# Patient Record
Sex: Female | Born: 1995 | Race: Black or African American | Hispanic: No | Marital: Single | Smoking: Never smoker
Health system: Southern US, Community
[De-identification: ages and names within clinical notes are randomized; demographics above are authoritative.]

## PROBLEM LIST (undated history)

## (undated) DIAGNOSIS — K219 Gastro-esophageal reflux disease without esophagitis: Secondary | ICD-10-CM

## (undated) DIAGNOSIS — N200 Calculus of kidney: Secondary | ICD-10-CM

## (undated) DIAGNOSIS — F419 Anxiety disorder, unspecified: Secondary | ICD-10-CM

## (undated) DIAGNOSIS — J45909 Unspecified asthma, uncomplicated: Secondary | ICD-10-CM

## (undated) DIAGNOSIS — D649 Anemia, unspecified: Secondary | ICD-10-CM

## (undated) HISTORY — DX: Anxiety disorder, unspecified: F41.9

## (undated) HISTORY — DX: Calculus of kidney: N20.0

## (undated) HISTORY — PX: APPENDECTOMY: SHX54

---
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
## 2016-05-14 ENCOUNTER — Emergency Department
Admission: EM | Admit: 2016-05-14 | Discharge: 2016-05-14 | Disposition: A | Discharge status: Home / Self Care | Payer: Self-pay | Attending: Emergency Medicine | Admitting: Emergency Medicine

## 2016-05-14 DIAGNOSIS — Y990 Civilian activity done for income or pay: Secondary | ICD-10-CM | POA: Insufficient documentation

## 2016-05-14 DIAGNOSIS — M25531 Pain in right wrist: Secondary | ICD-10-CM | POA: Insufficient documentation

## 2016-05-14 DIAGNOSIS — Z79899 Other long term (current) drug therapy: Secondary | ICD-10-CM | POA: Insufficient documentation

## 2016-05-14 DIAGNOSIS — X503XXA Overexertion from repetitive movements, initial encounter: Secondary | ICD-10-CM | POA: Insufficient documentation

## 2016-05-14 DIAGNOSIS — Y9389 Activity, other specified: Secondary | ICD-10-CM | POA: Insufficient documentation

## 2016-05-14 DIAGNOSIS — H578 Other specified disorders of eye and adnexa: Secondary | ICD-10-CM | POA: Insufficient documentation

## 2016-05-14 DIAGNOSIS — Y929 Unspecified place or not applicable: Secondary | ICD-10-CM | POA: Insufficient documentation

## 2016-05-14 DIAGNOSIS — M25831 Other specified joint disorders, right wrist: Secondary | ICD-10-CM

## 2016-05-14 DIAGNOSIS — J45909 Unspecified asthma, uncomplicated: Secondary | ICD-10-CM | POA: Insufficient documentation

## 2016-05-14 MED ORDER — NAPROXEN 500 MG PO TABS: 500.0000 mg | ORAL_TABLET | Freq: Two times a day (BID) | ORAL | 0 refills | Status: DC

## 2016-05-14 MED ORDER — NAPROXEN 500 MG PO TABS
500.0000 mg | ORAL_TABLET | Freq: Once | ORAL | Status: AC
  Administered 2016-05-14: 500 mg via ORAL
  Filled 2016-05-14: qty 1

## 2016-05-14 NOTE — Discharge Instructions (Signed)
Wrist splint for 10-14 days while working.

## 2016-05-14 NOTE — ED Triage Notes (Signed)
Pt presents to ED with c/o RIGHT hand and wrist pain with swelling that started 2 weeks ago. Pt denies any known recent injury or trauma, CMS intact. Pt reports pain is a 6/10 with radiation into her RIGHT forearm.

## 2016-05-14 NOTE — ED Provider Notes (Signed)
[facility] Emergency Department Provider Note   ____________________________________________   First MD Initiated Contact with Patient 05/14/16 2138     (approximate)  I have reviewed the triage vital signs and the nursing notes.   HISTORY  Chief Complaint Hand Pain    HPI Cheryl Davenport is a 20 y.o. female patient complaining of right wrist and hand pain for 2 weeks. Patient denies any provocative incident. Further history revealed repetitive flexion and extension motions of her hand at work. Patient is right-hand dominant. Patient stated time she is a intermitting numbness to the third through the fifth digit on her right hand.Patient's stay when his pain she rates as a 6/10. Patient describes her pain as "sharp and achy". Patient is using an elastic wrist support which stay has not relieved her complaint.    Past Medical History:  Diagnosis Date  . Anemia   . Anxiety   . Asthma   . GERD (gastroesophageal reflux disease)   . Renal stone     Patient Active Problem List   Diagnosis Date Noted  . Abdominal pain, epigastric   . Other diseases of stomach and duodenum   . Kidney stones 03/11/2015    Past Surgical History:  Procedure Laterality Date  . APPENDECTOMY    . CYSTOSCOPY W/ RETROGRADES Right 03/20/2015   Procedure: CYSTOSCOPY WITH RETROGRADE PYELOGRAM;  Surgeon: Crist FatBenjamin W Herrick, MD;  Location: [facility];  Service: Urology;  Laterality: Right;  . CYSTOSCOPY/URETEROSCOPY/HOLMIUM LASER/STENT PLACEMENT Right 03/20/2015   Procedure: CYSTOSCOPY/URETEROSCOPY/HOLMIUM LASER/STENT PLACEMENT;  Surgeon: Crist FatBenjamin W Herrick, MD;  Location: [facility];  Service: Urology;  Laterality: Right;  . ESOPHAGOGASTRODUODENOSCOPY (EGD) WITH PROPOFOL N/A 10/12/2015   Procedure: ESOPHAGOGASTRODUODENOSCOPY (EGD) WITH PROPOFOL;  Surgeon: Midge Miniumarren Wohl, MD;  [facility];  Service: Endoscopy;  Laterality: N/A;  . EXTRACORPOREAL SHOCK WAVE LITHOTRIPSY  Right 02/01/2015   Procedure: EXTRACORPOREAL SHOCK WAVE LITHOTRIPSY (ESWL);  Surgeon: Lorraine Laxichard D Hart, MD;  Location: [facility];  Service: Urology;  Laterality: Right;  . WISDOM TOOTH EXTRACTION  2016    Prior to Admission medications   Medication Sig Start Date End Date Taking? Authorizing Provider  calcium carbonate (TUMS - DOSED IN MG ELEMENTAL CALCIUM) 500 MG chewable tablet Chew 1 tablet by mouth as needed for indigestion or heartburn.    Historical Provider, MD  dexlansoprazole (DEXILANT) 60 MG capsule Take 1 capsule (60 mg total) by mouth daily. Patient not taking: Reported on 10/08/2015 10/01/15   Gayla DossEryka A Gayle, MD  famotidine (PEPCID) 40 MG tablet Take 1 tablet (40 mg total) by mouth every evening. Patient not taking: Reported on 10/08/2015 09/15/15 09/14/16  Rebecka ApleyAllison P Webster, MD  naproxen (NAPROSYN) 500 MG tablet Take 1 tablet (500 mg total) by mouth 2 (two) times daily with a meal. 05/14/16   Joni Reiningonald K Arlynn Mcdermid, PA-C  sucralfate (CARAFATE) 1 g tablet Take 1 tablet (1 g total) by mouth 2 (two) times daily. Patient not taking: Reported on 10/08/2015 09/15/15   Rebecka ApleyAllison P Webster, MD  traMADol (ULTRAM) 50 MG tablet Take 1 tablet (50 mg total) by mouth every 8 (eight) hours as needed for severe pain (Do not drive while taking this medication.). 10/01/15 09/30/16  Gayla DossEryka A Gayle, MD    Allergies Red dye  Family History  Problem Relation Age of Onset  . Kidney disease Maternal Grandmother   . Prostate cancer Neg Hx   . Bladder Cancer Neg Hx   . Diabetes Neg Hx   . Hypertension Neg Hx  Social History Social History  Substance Use Topics  . Smoking status: Never Smoker  . Smokeless tobacco: Never Used  . Alcohol use Yes     Comment: rarely, 1x/mo    Review of Systems Constitutional: No fever/chills Eyes: No visual changes. ENT: No sore throat. Cardiovascular: Denies chest pain. Respiratory: Denies shortness of breath. Gastrointestinal: No abdominal pain.  No nausea, no vomiting.  No  diarrhea.  No constipation. Genitourinary: Negative for dysuria. Musculoskeletal: Right hand pain Skin: Negative for rash. Neurological: Negative for headaches, focal weakness or numbness. Allergic/Immunilogical: Red dye  ____________________________________________   PHYSICAL EXAM:  VITAL SIGNS: ED Triage Vitals  Enc Vitals Group     BP 05/14/16 2115 117/73     Pulse Rate 05/14/16 2115 74     Resp 05/14/16 2115 16     Temp 05/14/16 2115 98.9 F (37.2 C)     Temp Source 05/14/16 2115 Oral     SpO2 05/14/16 2115 100 %     Weight 05/14/16 2116 140 lb (63.5 kg)     Height 05/14/16 2116 5\' 4"  (1.626 m)     Head Circumference --      Peak Flow --      Pain Score 05/14/16 2117 6     Pain Loc --      Pain Edu? --      Excl. in GC? --     Constitutional: Alert and oriented. Well appearing and in no acute distress. Eyes: Conjunctivae are normal. PERRL. EOMI. Head: Atraumatic. Nose: No congestion/rhinnorhea. Mouth/Throat: Mucous membranes are moist.  Oropharynx non-erythematous. Neck: No stridor.  No cervical spine tenderness to palpation. Hematological/Lymphatic/Immunilogical: No cervical lymphadenopathy. Cardiovascular: Normal rate, regular rhythm. Grossly normal heart sounds.  Good peripheral circulation. Respiratory: Normal respiratory effort.  No retractions. Lungs CTAB. Gastrointestinal: Soft and nontender. No distention. No abdominal bruits. No CVA tenderness. MusculoskeletalNo obvious deformity edema or erythema to the right wrist and hand. Patient had full nuchal range of motion of the right hand and wrist. Patient has moderate guarding palpation of the distal ulnar and radius.  Neurologic:  Normal speech and language. No gross focal neurologic deficits are appreciated. No gait instability. Skin:  Skin is warm, dry and intact. No rash noted. No edema ecchymosis or erythema. Psychiatric: Mood and affect are normal. Speech and behavior are  normal.  ____________________________________________   LABS (all labs ordered are listed, but only abnormal results are displayed)  Labs Reviewed - No data to display ____________________________________________  EKG   ____________________________________________  RADIOLOGY   ____________________________________________   PROCEDURES  Procedure(s) performed: None  Procedures  Critical Care performed: No  ____________________________________________   INITIAL IMPRESSION / ASSESSMENT AND PLAN / ED COURSE  Pertinent labs & imaging results that were available during my care of the patient were reviewed by me and considered in my medical decision making (see chart for details).  Right hand wrist pain secondary to repetitive motion. Patient given discharge care instructions. Patient given a prescription for naproxen. Patient of possible were wrist support while at work. Follow-up with family doctor no improvement in 10-14 days.  Clinical Course      ____________________________________________   FINAL CLINICAL IMPRESSION(S) / ED DIAGNOSES  Final diagnoses:  Repetitive motion disease of right wrist      NEW MEDICATIONS STARTED DURING THIS VISIT:  New Prescriptions   NAPROXEN (NAPROSYN) 500 MG TABLET    Take 1 tablet (500 mg total) by mouth 2 (two) times daily with a meal.  Note:  This document was prepared using Dragon voice recognition software and may include unintentional dictation errors.    Joni ReiningRonald K Lyllie Cobbins, PA-C 05/14/16 2154    Nita Sicklearolina Veronese, MD 05/14/16 [phone]

## 2016-10-21 ENCOUNTER — Emergency Department
Admission: EM | Admit: 2016-10-21 | Discharge: 2016-10-21 | Disposition: A | Discharge status: Home / Self Care | Payer: Medicaid Other | Attending: Emergency Medicine | Admitting: Emergency Medicine

## 2016-10-21 ENCOUNTER — Encounter: Payer: Self-pay | Admitting: Emergency Medicine

## 2016-10-21 DIAGNOSIS — J45909 Unspecified asthma, uncomplicated: Secondary | ICD-10-CM | POA: Insufficient documentation

## 2016-10-21 DIAGNOSIS — F1290 Cannabis use, unspecified, uncomplicated: Secondary | ICD-10-CM | POA: Insufficient documentation

## 2016-10-21 DIAGNOSIS — J029 Acute pharyngitis, unspecified: Secondary | ICD-10-CM

## 2016-10-21 DIAGNOSIS — H6993 Unspecified Eustachian tube disorder, bilateral: Secondary | ICD-10-CM

## 2016-10-21 DIAGNOSIS — H6983 Other specified disorders of Eustachian tube, bilateral: Secondary | ICD-10-CM

## 2016-10-21 LAB — POCT RAPID STREP A: STREPTOCOCCUS, GROUP A SCREEN (DIRECT): NEGATIVE

## 2016-10-21 MED ORDER — IBUPROFEN 600 MG PO TABS: 600.0000 mg | ORAL_TABLET | Freq: Three times a day (TID) | ORAL | 0 refills | Status: DC | PRN

## 2016-10-21 MED ORDER — LIDOCAINE VISCOUS 2 % MT SOLN
OROMUCOSAL | Status: AC
  Administered 2016-10-21: 15 mL via OROMUCOSAL
  Filled 2016-10-21: qty 15

## 2016-10-21 MED ORDER — DIPHENHYDRAMINE HCL 12.5 MG/5ML PO ELIX
25.0000 mg | ORAL_SOLUTION | Freq: Once | ORAL | Status: AC
  Administered 2016-10-21: 25 mg via ORAL

## 2016-10-21 MED ORDER — LIDOCAINE VISCOUS 2 % MT SOLN
15.0000 mL | Freq: Once | OROMUCOSAL | Status: AC
  Administered 2016-10-21: 15 mL via OROMUCOSAL

## 2016-10-21 MED ORDER — DIPHENHYDRAMINE HCL 12.5 MG/5ML PO ELIX
ORAL_SOLUTION | ORAL | Status: AC
  Administered 2016-10-21: 25 mg via ORAL
  Filled 2016-10-21: qty 10

## 2016-10-21 MED ORDER — FEXOFENADINE-PSEUDOEPHED ER 60-120 MG PO TB12: 1.0000 | ORAL_TABLET | Freq: Two times a day (BID) | ORAL | 0 refills | Status: DC

## 2016-10-21 MED ORDER — LIDOCAINE VISCOUS 2 % MT SOLN: 5.0000 mL | Freq: Four times a day (QID) | OROMUCOSAL | 0 refills | Status: DC | PRN

## 2016-10-21 MED ORDER — FIRST-DUKES MOUTHWASH MT SUSP: 10.0000 mL | Freq: Four times a day (QID) | OROMUCOSAL | 0 refills | Status: DC

## 2016-10-21 NOTE — ED Triage Notes (Signed)
PT c/o headache x 2 days, and sore throat x 3 days.

## 2016-10-21 NOTE — ED Notes (Addendum)
POCT strep - negative

## 2016-10-21 NOTE — ED Provider Notes (Signed)
[facility] Emergency Department Provider Note   ____________________________________________   None    (approximate)  I have reviewed the triage vital signs and the nursing notes.   HISTORY  Chief Complaint Migraine and Sore Throat    HPI Cheryl Davenport is a 21 y.o. female patient complained of frontal headache for 2 days. Patient also complaining of sore throat for 3 days. Patient state has bilateral ear pressure denies any fever. Patient states symptoms worsened after a awakening. Except for over-the-counter Tylenol no other palliative measures for this complaint.  Past Medical History:  Diagnosis Date  . Anemia   . Anxiety   . Asthma   . GERD (gastroesophageal reflux disease)   . Renal stone     Patient Active Problem List   Diagnosis Date Noted  . Abdominal pain, epigastric   . Other diseases of stomach and duodenum   . Kidney stones 03/11/2015    Past Surgical History:  Procedure Laterality Date  . APPENDECTOMY    . CYSTOSCOPY W/ RETROGRADES Right 03/20/2015   Procedure: CYSTOSCOPY WITH RETROGRADE PYELOGRAM;  Surgeon: Crist FatBenjamin W Herrick, MD;  Location: [facility];  Service: Urology;  Laterality: Right;  . CYSTOSCOPY/URETEROSCOPY/HOLMIUM LASER/STENT PLACEMENT Right 03/20/2015   Procedure: CYSTOSCOPY/URETEROSCOPY/HOLMIUM LASER/STENT PLACEMENT;  Surgeon: Crist FatBenjamin W Herrick, MD;  Location: [facility];  Service: Urology;  Laterality: Right;  . ESOPHAGOGASTRODUODENOSCOPY (EGD) WITH PROPOFOL N/A 10/12/2015   Procedure: ESOPHAGOGASTRODUODENOSCOPY (EGD) WITH PROPOFOL;  Surgeon: Midge Miniumarren Wohl, MD;  [facility];  Service: Endoscopy;  Laterality: N/A;  . EXTRACORPOREAL SHOCK WAVE LITHOTRIPSY Right 02/01/2015   Procedure: EXTRACORPOREAL SHOCK WAVE LITHOTRIPSY (ESWL);  Surgeon: Lorraine Laxichard D Hart, MD;  Location: [facility];  Service: Urology;  Laterality: Right;  . WISDOM TOOTH EXTRACTION  2016    Prior to Admission medications   Medication Sig  Start Date End Date Taking? Authorizing Provider  calcium carbonate (TUMS - DOSED IN MG ELEMENTAL CALCIUM) 500 MG chewable tablet Chew 1 tablet by mouth as needed for indigestion or heartburn.    [provider]  dexlansoprazole (DEXILANT) 60 MG capsule Take 1 capsule (60 mg total) by mouth daily. Patient not taking: Reported on 10/08/2015 10/01/15   Gayla DossGayle, Eryka A, MD  Diphenhyd-Hydrocort-Nystatin (FIRST-DUKES MOUTHWASH) SUSP Use as directed 10 mLs in the mouth or throat 4 (four) times daily. 10/21/16   Joni ReiningSmith, Jillian Pianka K, PA-C  famotidine (PEPCID) 40 MG tablet Take 1 tablet (40 mg total) by mouth every evening. Patient not taking: Reported on 10/08/2015 09/15/15 09/14/16  Rebecka ApleyWebster, Allison P, MD  fexofenadine-pseudoephedrine (ALLEGRA-D) 60-120 MG 12 hr tablet Take 1 tablet by mouth 2 (two) times daily. 10/21/16   Joni ReiningSmith, Tedric Leeth K, PA-C  ibuprofen (ADVIL,MOTRIN) 600 MG tablet Take 1 tablet (600 mg total) by mouth every 8 (eight) hours as needed. 10/21/16   Joni ReiningSmith, Trevaun Rendleman K, PA-C  lidocaine (XYLOCAINE) 2 % solution Use as directed 5 mLs in the mouth or throat every 6 (six) hours as needed for mouth pain. 5 mL mixed with 10 mL of a mouthwash to swish and swallow 10/21/16   Joni ReiningSmith, Delia Slatten K, PA-C  naproxen (NAPROSYN) 500 MG tablet Take 1 tablet (500 mg total) by mouth 2 (two) times daily with a meal. 05/14/16   Joni ReiningSmith, Lattie Cervi K, PA-C  sucralfate (CARAFATE) 1 g tablet Take 1 tablet (1 g total) by mouth 2 (two) times daily. Patient not taking: Reported on 10/08/2015 09/15/15   Rebecka ApleyWebster, Allison P, MD    Allergies Red dye  Family History  Problem Relation Age of Onset  . Kidney disease Maternal Grandmother   . Prostate cancer Neg Hx   . Bladder Cancer Neg Hx   . Diabetes Neg Hx   . Hypertension Neg Hx     Social History Social History  Substance Use Topics  . Smoking status: Never Smoker  . Smokeless tobacco: Never Used  . Alcohol use Yes     Comment: rarely, 1x/mo    Review of  Systems  Constitutional: No fever/chills Eyes: No visual changes. ZOX:WRUE throat Cardiovascular: Denies chest pain. Respiratory: Denies shortness of breath. Gastrointestinal: No abdominal pain.  No nausea, no vomiting.  No diarrhea.  No constipation. Genitourinary: Negative for dysuria. Musculoskeletal: Negative for back pain. Skin: Negative for rash. Neurological: Positive for headaches, but denies focal weakness or numbness. Psychiatric:Anxiety  ____________________________________________   PHYSICAL EXAM:  VITAL SIGNS: ED Triage Vitals [10/21/16 0704]  Enc Vitals Group     BP 129/77     Pulse Rate 81     Resp 18     Temp 98.5 F (36.9 C)     Temp Source Oral     SpO2 100 %     Weight 140 lb (63.5 kg)     Height 5\' 4"  (1.626 m)     Head Circumference      Peak Flow      Pain Score 10     Pain Loc      Pain Edu?      Excl. in GC?     Constitutional: Alert and oriented. Well appearing and in no acute distress. Eyes: Conjunctivae are normal. PERRL. EOMI. Head: Atraumatic. Nose: No congestion/rhinnorhea. Mouth/Throat: Mucous membranes are moist.  Oropharynx erythematous . Edematous bilateral tonsillar Neck: No stridor.  No cervical spine tenderness to palpation. Hematological/Lymphatic/Immunilogical: No cervical lymphadenopathy. Cardiovascular: Normal rate, regular rhythm. Grossly normal heart sounds.  Good peripheral circulation. Respiratory: Normal respiratory effort.  No retractions. Lungs CTAB. Gastrointestinal: Soft and nontender. No distention. No abdominal bruits. No CVA tenderness. Musculoskeletal: No lower extremity tenderness nor edema.  No joint effusions. Neurologic:  Normal speech and language. No gross focal neurologic deficits are appreciated. No gait instability. Skin:  Skin is warm, dry and intact. No rash noted. Psychiatric: Mood and affect are normal. Speech and behavior are normal.  ____________________________________________   LABS (all  labs ordered are listed, but only abnormal results are displayed)  Labs Reviewed - No data to display ____________________________________________  EKG   ____________________________________________  RADIOLOGY   ____________________________________________   PROCEDURES  Procedure(s) performed: None  Procedures  Critical Care performed: No  ____________________________________________   INITIAL IMPRESSION / ASSESSMENT AND PLAN / ED COURSE  Pertinent labs & imaging results that were available during my care of the patient were reviewed by me and considered in my medical decision making (see chart for details). Viral pharyngitis and eustachian tube dysfunction. Patient had a negative rapid strep culture is pending. Patient given discharge care instructions. Patient given a school and work no. Patient advised to follow-up family doctor condition persists.      ____________________________________________   FINAL CLINICAL IMPRESSION(S) / ED DIAGNOSES  Final diagnoses:  Viral pharyngitis  Eustachian tube dysfunction, bilateral      NEW MEDICATIONS STARTED DURING THIS VISIT:  New Prescriptions   DIPHENHYD-HYDROCORT-NYSTATIN (FIRST-DUKES MOUTHWASH) SUSP    Use as directed 10 mLs in the mouth or throat 4 (four) times daily.   FEXOFENADINE-PSEUDOEPHEDRINE (ALLEGRA-D) 60-120 MG 12 HR TABLET    Take 1 tablet by mouth  2 (two) times daily.   IBUPROFEN (ADVIL,MOTRIN) 600 MG TABLET    Take 1 tablet (600 mg total) by mouth every 8 (eight) hours as needed.   LIDOCAINE (XYLOCAINE) 2 % SOLUTION    Use as directed 5 mLs in the mouth or throat every 6 (six) hours as needed for mouth pain. 5 mL mixed with 10 mL of a mouthwash to swish and swallow     Note:  This document was prepared using Dragon voice recognition software and may include unintentional dictation errors.    Joni Reining, PA-C 10/21/16 0749    Nita Sickle, MD 10/21/16 [phone]

## 2016-10-21 NOTE — ED Notes (Signed)
See triage note  Developed sore throat about 3 days ago  Sx's are worse this am  Possible fever over the weekend  Throat red and swollen. afebrile on arrival   Also has been having generalized headache

## 2016-11-17 ENCOUNTER — Encounter: Payer: Self-pay | Admitting: Emergency Medicine

## 2016-11-17 ENCOUNTER — Emergency Department
Admission: EM | Admit: 2016-11-17 | Discharge: 2016-11-17 | Disposition: A | Discharge status: Home / Self Care | Payer: Medicaid Other | Attending: Emergency Medicine | Admitting: Emergency Medicine

## 2016-11-17 DIAGNOSIS — Z79899 Other long term (current) drug therapy: Secondary | ICD-10-CM | POA: Insufficient documentation

## 2016-11-17 DIAGNOSIS — R55 Syncope and collapse: Secondary | ICD-10-CM | POA: Insufficient documentation

## 2016-11-17 DIAGNOSIS — J45909 Unspecified asthma, uncomplicated: Secondary | ICD-10-CM | POA: Insufficient documentation

## 2016-11-17 LAB — CBC
HEMATOCRIT: 37.8 % (ref 35.0–47.0)
Hemoglobin: 12.3 g/dL (ref 12.0–16.0)
MCH: 25.5 pg — AB (ref 26.0–34.0)
MCHC: 32.4 g/dL (ref 32.0–36.0)
MCV: 78.5 fL — ABNORMAL LOW (ref 80.0–100.0)
PLATELETS: 172 10*3/uL (ref 150–440)
RBC: 4.81 MIL/uL (ref 3.80–5.20)
RDW: 14.7 % — AB (ref 11.5–14.5)
WBC: 7.6 10*3/uL (ref 3.6–11.0)

## 2016-11-17 LAB — BASIC METABOLIC PANEL
ANION GAP: 8 (ref 5–15)
BUN: 8 mg/dL (ref 6–20)
CO2: 25 mmol/L (ref 22–32)
Calcium: 9.3 mg/dL (ref 8.9–10.3)
Chloride: 102 mmol/L (ref 101–111)
Creatinine, Ser: 0.86 mg/dL (ref 0.44–1.00)
GFR calc Af Amer: >60 mL/min (ref >60)
GLUCOSE: 82 mg/dL (ref 65–99)
Potassium: 3.8 mmol/L (ref 3.5–5.1)
SODIUM: 135 mmol/L (ref 135–145)

## 2016-11-17 LAB — URINALYSIS, COMPLETE (UACMP) WITH MICROSCOPIC
BACTERIA UA: NONE SEEN
BILIRUBIN URINE: NEGATIVE
Glucose, UA: NEGATIVE mg/dL
Hgb urine dipstick: NEGATIVE
Ketones, ur: 20 mg/dL — AB
Leukocytes, UA: NEGATIVE
Nitrite: NEGATIVE
Protein, ur: 30 mg/dL — AB
SPECIFIC GRAVITY, URINE: 1.032 — AB (ref 1.005–1.030)
pH: 5 (ref 5.0–8.0)

## 2016-11-17 LAB — TSH: TSH: 0.337 u[IU]/mL — AB (ref 0.350–4.500)

## 2016-11-17 LAB — TROPONIN I

## 2016-11-17 LAB — POCT PREGNANCY, URINE: PREG TEST UR: NEGATIVE

## 2016-11-17 MED ORDER — BUTALBITAL-APAP-CAFFEINE 50-325-40 MG PO TABS
1.0000 | ORAL_TABLET | Freq: Once | ORAL | Status: AC
Start: 2016-11-17 — End: 2016-11-17
  Administered 2016-11-17: 1 via ORAL
  Filled 2016-11-17: qty 1

## 2016-11-17 MED ORDER — METOCLOPRAMIDE HCL 10 MG PO TABS: 10.0000 mg | ORAL_TABLET | Freq: Once | ORAL | Status: DC

## 2016-11-17 NOTE — ED Notes (Addendum)
Lab called to add on TSH at this time

## 2016-11-17 NOTE — ED Triage Notes (Signed)
Syncopal episode at work today. States has been feeling weak x 2 days.

## 2016-11-17 NOTE — ED Notes (Signed)
Pt c/o weakness xfew days with decreased appetite. Pt states witnessed syncopal episode at work today. Pt denies any pain or n/v/d. Pt A&Ox4, NAD noted

## 2016-11-17 NOTE — Discharge Instructions (Signed)
Please seek medical attention for any high fevers, chest pain, shortness of breath, change in behavior, persistent vomiting, bloody stool or any other new or concerning symptoms.  

## 2016-11-17 NOTE — ED Notes (Signed)
Lab called for add on of BMP at this time

## 2016-11-17 NOTE — ED Provider Notes (Signed)
[facility] Emergency Department Provider Note  ____________________________________________   I have reviewed the triage vital signs and the nursing notes.   HISTORY  Chief Complaint Loss of Consciousness and Weakness   History limited by: Not Limited   HPI Cheryl Davenport is a 21 y.o. female who presents to the emergency department today because of concerns for syncopal episode at work today. The patient states she started feeling dizzy and then blacked out. She states she woke up right after hitting the ground. She did not have any associated chest pain or shortness breath. The patient however does state that for roughly the past month she has not been feeling well. She was treated for pharyngitis roughly 1 month ago and states ever stopping the medication she started having throat pain as well as ear pain again. The patient denies any recent fevers. She denies following up with primary care.   Past Medical History:  Diagnosis Date  . Anemia   . Anxiety   . Asthma   . GERD (gastroesophageal reflux disease)   . Renal stone     Patient Active Problem List   Diagnosis Date Noted  . Abdominal pain, epigastric   . Other diseases of stomach and duodenum   . Kidney stones 03/11/2015    Past Surgical History:  Procedure Laterality Date  . APPENDECTOMY    . CYSTOSCOPY W/ RETROGRADES Right 03/20/2015   Procedure: CYSTOSCOPY WITH RETROGRADE PYELOGRAM;  Surgeon: Crist Fat, MD;  Location: [facility];  Service: Urology;  Laterality: Right;  . CYSTOSCOPY/URETEROSCOPY/HOLMIUM LASER/STENT PLACEMENT Right 03/20/2015   Procedure: CYSTOSCOPY/URETEROSCOPY/HOLMIUM LASER/STENT PLACEMENT;  Surgeon: Crist Fat, MD;  Location: [facility];  Service: Urology;  Laterality: Right;  . ESOPHAGOGASTRODUODENOSCOPY (EGD) WITH PROPOFOL N/A 10/12/2015   Procedure: ESOPHAGOGASTRODUODENOSCOPY (EGD) WITH PROPOFOL;  Surgeon: Midge Minium, MD;  Location: [facility];   Service: Endoscopy;  Laterality: N/A;  . EXTRACORPOREAL SHOCK WAVE LITHOTRIPSY Right 02/01/2015   Procedure: EXTRACORPOREAL SHOCK WAVE LITHOTRIPSY (ESWL);  Surgeon: Lorraine Lax, MD;  Location: [facility];  Service: Urology;  Laterality: Right;  . WISDOM TOOTH EXTRACTION  2016    Prior to Admission medications   Medication Sig Start Date End Date Taking? Authorizing Provider  calcium carbonate (TUMS - DOSED IN MG ELEMENTAL CALCIUM) 500 MG chewable tablet Chew 1 tablet by mouth as needed for indigestion or heartburn.    [provider]  dexlansoprazole (DEXILANT) 60 MG capsule Take 1 capsule (60 mg total) by mouth daily. Patient not taking: Reported on 10/08/2015 10/01/15   Gayla Doss, MD  Diphenhyd-Hydrocort-Nystatin (FIRST-DUKES MOUTHWASH) SUSP Use as directed 10 mLs in the mouth or throat 4 (four) times daily. 10/21/16   Joni Reining, PA-C  famotidine (PEPCID) 40 MG tablet Take 1 tablet (40 mg total) by mouth every evening. Patient not taking: Reported on 10/08/2015 09/15/15 09/14/16  Rebecka Apley, MD  fexofenadine-pseudoephedrine (ALLEGRA-D) 60-120 MG 12 hr tablet Take 1 tablet by mouth 2 (two) times daily. 10/21/16   Joni Reining, PA-C  ibuprofen (ADVIL,MOTRIN) 600 MG tablet Take 1 tablet (600 mg total) by mouth every 8 (eight) hours as needed. 10/21/16   Joni Reining, PA-C  lidocaine (XYLOCAINE) 2 % solution Use as directed 5 mLs in the mouth or throat every 6 (six) hours as needed for mouth pain. 5 mL mixed with 10 mL of a mouthwash to swish and swallow 10/21/16   Joni Reining, PA-C  naproxen (NAPROSYN) 500 MG tablet Take 1  tablet (500 mg total) by mouth 2 (two) times daily with a meal. 05/14/16   Joni ReiningSmith, Ronald K, PA-C  sucralfate (CARAFATE) 1 g tablet Take 1 tablet (1 g total) by mouth 2 (two) times daily. Patient not taking: Reported on 10/08/2015 09/15/15   Rebecka ApleyWebster, Allison P, MD    Allergies Red dye  Family History  Problem Relation Age of Onset  . Kidney disease  Maternal Grandmother   . Prostate cancer Neg Hx   . Bladder Cancer Neg Hx   . Diabetes Neg Hx   . Hypertension Neg Hx     Social History Social History  Substance Use Topics  . Smoking status: Never Smoker  . Smokeless tobacco: Never Used  . Alcohol use Yes     Comment: rarely, 1x/mo    Review of Systems Constitutional: No fever/chills Eyes: No visual changes. ENT: No sore throat. Cardiovascular: Denies chest pain. Respiratory: Denies shortness of breath. Gastrointestinal: No abdominal pain.  No nausea, no vomiting.  No diarrhea.   Genitourinary: Negative for dysuria. Musculoskeletal: Negative for back pain. Skin: Negative for rash. Neurological: Negative for headaches, focal weakness or numbness.  ____________________________________________   PHYSICAL EXAM:  VITAL SIGNS: ED Triage Vitals [11/17/16 1300]  Enc Vitals Group     BP 104/67     Pulse Rate 84     Resp 16     Temp 98.6 F (37 C)     Temp Source Oral     SpO2 100 %     Weight 142 lb (64.4 kg)     Height 5\' 4"  (1.626 m)     Head Circumference      Peak Flow      Pain Score 10   Constitutional: Alert and oriented. Well appearing and in no distress. Eyes: Conjunctivae are normal.  ENT   Head: Normocephalic and atraumatic. Bilateral TMs wnl.   Nose: No congestion/rhinnorhea.   Mouth/Throat: Mucous membranes are moist.   Neck: No stridor. Hematological/Lymphatic/Immunilogical: No cervical lymphadenopathy. Cardiovascular: Normal rate, regular rhythm.  No murmurs, rubs, or gallops.  Respiratory: Normal respiratory effort without tachypnea nor retractions. Breath sounds are clear and equal bilaterally. No wheezes/rales/rhonchi. Gastrointestinal: Soft and non tender. No rebound. No guarding.  Genitourinary: Deferred Musculoskeletal: Normal range of motion in all extremities. No lower extremity edema. Neurologic:  Normal speech and language. No gross focal neurologic deficits are appreciated.   Skin:  Skin is warm, dry and intact. No rash noted. Psychiatric: Mood and affect are normal. Speech and behavior are normal. Patient exhibits appropriate insight and judgment.  ____________________________________________    LABS (pertinent positives/negatives)  Labs Reviewed  CBC - Abnormal; Notable for the following:       Result Value   MCV 78.5 (*)    MCH 25.5 (*)    RDW 14.7 (*)    All other components within normal limits  URINALYSIS, COMPLETE (UACMP) WITH MICROSCOPIC - Abnormal; Notable for the following:    Color, Urine AMBER (*)    APPearance CLEAR (*)    Specific Gravity, Urine 1.032 (*)    Ketones, ur 20 (*)    Protein, ur 30 (*)    Squamous Epithelial / LPF 0-5 (*)    All other components within normal limits  TSH - Abnormal; Notable for the following:    TSH 0.337 (*)    All other components within normal limits  TROPONIN I  BASIC METABOLIC PANEL  POC URINE PREG, ED  POCT PREGNANCY, URINE  ____________________________________________   EKG  IPhineas Semen, attending physician, personally viewed and interpreted this EKG  EKG Time: 1323 Rate: 80 Rhythm: normal sinus rhythm Axis: normal Intervals: qtc 429 QRS: narrow ST changes: no st elevation, t wave inversion V1, V2 Impression: abnormal ekg   ____________________________________________    RADIOLOGY  None  ____________________________________________   PROCEDURES  Procedures  ____________________________________________   INITIAL IMPRESSION / ASSESSMENT AND PLAN / ED COURSE  Pertinent labs & imaging results that were available during my care of the patient were reviewed by me and considered in my medical decision making (see chart for details).  Patient presents to the emergency department today after a syncopal episode at work. Patient states she didn't lose consciousness. Patient neuro intact here. This point I do not feel any emergent neuro imaging is warranted. Patient  did feel some improvement after medication. TSH was slightly low, however more clinical concern for hypothyroid. At this point do not think clinically significant. Will have patient follow up with PCP.  ____________________________________________   FINAL CLINICAL IMPRESSION(S) / ED DIAGNOSES  Final diagnoses:  Syncope, unspecified syncope type     Note: This dictation was prepared with Dragon dictation. Any transcriptional errors that result from this process are unintentional     Phineas Semen, MD 11/17/16 1739

## 2016-11-17 NOTE — ED Triage Notes (Signed)
Pt reports syncopal episode today and hit back of head, reports ringing in ears. Pt reports dx with pharyngitis 1 month ago, reports continued weakness.

## 2016-11-19 ENCOUNTER — Emergency Department: Payer: Self-pay

## 2016-11-19 ENCOUNTER — Encounter: Payer: Self-pay | Admitting: *Deleted

## 2016-11-19 ENCOUNTER — Emergency Department
Admission: EM | Admit: 2016-11-19 | Discharge: 2016-11-19 | Disposition: A | Discharge status: Home / Self Care | Payer: Self-pay | Attending: Emergency Medicine | Admitting: Emergency Medicine

## 2016-11-19 DIAGNOSIS — R519 Headache, unspecified: Secondary | ICD-10-CM

## 2016-11-19 DIAGNOSIS — J45909 Unspecified asthma, uncomplicated: Secondary | ICD-10-CM | POA: Insufficient documentation

## 2016-11-19 DIAGNOSIS — J039 Acute tonsillitis, unspecified: Secondary | ICD-10-CM

## 2016-11-19 DIAGNOSIS — R51 Headache: Secondary | ICD-10-CM | POA: Insufficient documentation

## 2016-11-19 LAB — URINALYSIS, COMPLETE (UACMP) WITH MICROSCOPIC
BILIRUBIN URINE: NEGATIVE
Bacteria, UA: NONE SEEN
GLUCOSE, UA: NEGATIVE mg/dL
Hgb urine dipstick: NEGATIVE
KETONES UR: 20 mg/dL — AB
Leukocytes, UA: NEGATIVE
NITRITE: NEGATIVE
PH: 5 (ref 5.0–8.0)
Protein, ur: 30 mg/dL — AB
RBC / HPF: NONE SEEN RBC/hpf (ref 0–5)
Specific Gravity, Urine: 1.028 (ref 1.005–1.030)

## 2016-11-19 LAB — CBC WITH DIFFERENTIAL/PLATELET
BASOS PCT: 0 %
Basophils Absolute: 0 10*3/uL (ref 0–0.1)
EOS ABS: 0 10*3/uL (ref 0–0.7)
Eosinophils Relative: 0 %
HCT: 35.5 % (ref 35.0–47.0)
Hemoglobin: 11.6 g/dL — ABNORMAL LOW (ref 12.0–16.0)
Lymphocytes Relative: 28 %
Lymphs Abs: 1.9 10*3/uL (ref 1.0–3.6)
MCH: 25.3 pg — ABNORMAL LOW (ref 26.0–34.0)
MCHC: 32.8 g/dL (ref 32.0–36.0)
MCV: 77.2 fL — ABNORMAL LOW (ref 80.0–100.0)
MONO ABS: 0.9 10*3/uL (ref 0.2–0.9)
MONOS PCT: 14 %
Neutro Abs: 3.9 10*3/uL (ref 1.4–6.5)
Neutrophils Relative %: 58 %
PLATELETS: 165 10*3/uL (ref 150–440)
RBC: 4.6 MIL/uL (ref 3.80–5.20)
RDW: 14.7 % — AB (ref 11.5–14.5)
WBC: 6.7 10*3/uL (ref 3.6–11.0)

## 2016-11-19 LAB — MONONUCLEOSIS SCREEN: MONO SCREEN: NEGATIVE

## 2016-11-19 LAB — POCT RAPID STREP A: STREPTOCOCCUS, GROUP A SCREEN (DIRECT): NEGATIVE

## 2016-11-19 MED ORDER — AMOXICILLIN-POT CLAVULANATE 500-125 MG PO TABS: 1.0000 | ORAL_TABLET | Freq: Three times a day (TID) | ORAL | 0 refills | Status: DC

## 2016-11-19 NOTE — ED Notes (Signed)
States she was seen on Monday s/p syncopal episode  States she hit her head  conts to have pain to left side of head  Has tried OTC meds for headache w/o  Also having sore throat  States her throat has been hurting for about 2 months but states pain has increased over the past 2 days

## 2016-11-19 NOTE — ED Triage Notes (Signed)
States she was seen in ED Monday for a syncopal eipsidoe when she hit her head on the floor, states continued headache on the left side, also states sore throat for 2 months, awake and alert in no acute distress

## 2016-11-19 NOTE — Discharge Instructions (Signed)
Begin taking antibiotics today. Follow-up with your primary care doctor or [facility] if any continued problems. Take all the antibiotics until completely gone. You  can continue taking Tylenol or ibuprofen as needed for pain. Continue to drink lots of fluids.

## 2016-11-19 NOTE — ED Provider Notes (Signed)
[facility] Emergency Department Provider Note  ____________________________________________   First MD Initiated Contact with Patient 11/19/16 1005     (approximate)  I have reviewed the triage vital signs and the nursing notes.   HISTORY  Chief Complaint Headache and Sore Throat    HPI Cheryl Davenport is a 21 y.o. female is here with complaint of sore throat for approximately 2 months. Patient also is here because she continues to have a headache. She relates that she had a syncopal episode 3 days ago and hit her head on the floor. She was seen in the emergency room at that time and was neurologically intact. She did not follow-up with her PCP and his back to the ED for the complaints above. Mother wants ACT scan and also a strep test. Patient states she has not had any visual changes, nausea, vomiting, difficulty walking. She states that with her sore throat she is a been unaware of any fever, or chills. She denies any upper respiratory symptoms. She is taken some over-the-counter medication for her headache and throat pain without any relief. Currently she rates her pain as a 7 out of 10.   Past Medical History:  Diagnosis Date  . Anemia   . Anxiety   . Asthma   . GERD (gastroesophageal reflux disease)   . Renal stone     Patient Active Problem List   Diagnosis Date Noted  . Abdominal pain, epigastric   . Other diseases of stomach and duodenum   . Kidney stones 03/11/2015    Past Surgical History:  Procedure Laterality Date  . APPENDECTOMY    . CYSTOSCOPY W/ RETROGRADES Right 03/20/2015   Procedure: CYSTOSCOPY WITH RETROGRADE PYELOGRAM;  Surgeon: Crist FatBenjamin W Herrick, MD;  Location: [facility];  Service: Urology;  Laterality: Right;  . CYSTOSCOPY/URETEROSCOPY/HOLMIUM LASER/STENT PLACEMENT Right 03/20/2015   Procedure: CYSTOSCOPY/URETEROSCOPY/HOLMIUM LASER/STENT PLACEMENT;  Surgeon: Crist FatBenjamin W Herrick, MD;  Location: [facility];  Service: Urology;   Laterality: Right;  . ESOPHAGOGASTRODUODENOSCOPY (EGD) WITH PROPOFOL N/A 10/12/2015   Procedure: ESOPHAGOGASTRODUODENOSCOPY (EGD) WITH PROPOFOL;  Surgeon: Midge Miniumarren Wohl, MD;  [facility];  Service: Endoscopy;  Laterality: N/A;  . EXTRACORPOREAL SHOCK WAVE LITHOTRIPSY Right 02/01/2015   Procedure: EXTRACORPOREAL SHOCK WAVE LITHOTRIPSY (ESWL);  Surgeon: Lorraine Laxichard D Hart, MD;  Location: [facility];  Service: Urology;  Laterality: Right;  . WISDOM TOOTH EXTRACTION  2016    Prior to Admission medications   Medication Sig Start Date End Date Taking? Authorizing Provider  amoxicillin-clavulanate (AUGMENTIN) 500-125 MG tablet Take 1 tablet (500 mg total) by mouth 3 (three) times daily. 11/19/16   Tommi RumpsSummers, Tyaira Heward L, PA-C  calcium carbonate (TUMS - DOSED IN MG ELEMENTAL CALCIUM) 500 MG chewable tablet Chew 1 tablet by mouth as needed for indigestion or heartburn.    [provider]    Allergies Red dye  Family History  Problem Relation Age of Onset  . Kidney disease Maternal Grandmother   . Prostate cancer Neg Hx   . Bladder Cancer Neg Hx   . Diabetes Neg Hx   . Hypertension Neg Hx     Social History Social History  Substance Use Topics  . Smoking status: Never Smoker  . Smokeless tobacco: Never Used  . Alcohol use Yes     Comment: rarely, 1x/mo    Review of Systems  Constitutional: No fever/chills Eyes: No visual changes. ENT: Positive sore throat. Cardiovascular: Denies chest pain. Respiratory: Denies shortness of breath. Gastrointestinal: No abdominal pain.  No  nausea, no vomiting. Musculoskeletal: Negative for back pain. Skin: Negative for rash. Neurological: Positive for left-sided headaches, no focal weakness or numbness.   ____________________________________________   PHYSICAL EXAM:  VITAL SIGNS: ED Triage Vitals  Enc Vitals Group     BP 11/19/16 1001 118/87     Pulse Rate 11/19/16 1001 87     Resp 11/19/16 1001 16     Temp 11/19/16 1001 98.7  F (37.1 C)     Temp Source 11/19/16 1001 Oral     SpO2 11/19/16 1001 100 %     Weight 11/19/16 1001 142 lb (64.4 kg)     Height 11/19/16 1001 5\' 4"  (1.626 m)     Head Circumference --      Peak Flow --      Pain Score 11/19/16 1000 7     Pain Loc --      Pain Edu? --      Excl. in GC? --     Constitutional: Alert and oriented. Well appearing and in no acute distress. Eyes: Conjunctivae are normal. PERRL. EOMI. Head: There is no obvious deformity but minimal tenderness on palpation of the scalp left lateral aspect. No soft tissue swelling present. No abrasions were seen. Nose: No congestion/rhinnorhea. Mouth/Throat: Mucous membranes are moist.  Oropharynx Minimal erythema but tonsillar enlargement bilaterally with scattered minimal exudate present. Neck: No stridor.   Hematological/Lymphatic/Immunilogical: Bilateral cervical lymphadenopathy. Cardiovascular: Normal rate, regular rhythm. Grossly normal heart sounds.  Good peripheral circulation. Respiratory: Normal respiratory effort.  No retractions. Lungs CTAB. Musculoskeletal: Moves upper and lower extremities without any difficulty. Muscle strength is equal bilaterally. Normal gait was noted. Neurologic:  Normal speech and language. No gross focal neurologic deficits are appreciated. No gait instability. Reflexes are 2+ bilaterally. Skin:  Skin is warm, dry and intact. No ecchymosis, erythema or abrasions were noted. Psychiatric: Mood and affect are normal. Speech and behavior are normal.  ____________________________________________   LABS (all labs ordered are listed, but only abnormal results are displayed)  Labs Reviewed  URINALYSIS, COMPLETE (UACMP) WITH MICROSCOPIC - Abnormal; Notable for the following:       Result Value   Color, Urine YELLOW (*)    APPearance CLEAR (*)    Ketones, ur 20 (*)    Protein, ur 30 (*)    Squamous Epithelial / LPF 0-5 (*)    All other components within normal limits  CBC WITH  DIFFERENTIAL/PLATELET - Abnormal; Notable for the following:    Hemoglobin 11.6 (*)    MCV 77.2 (*)    MCH 25.3 (*)    RDW 14.7 (*)    All other components within normal limits  MONONUCLEOSIS SCREEN  POC URINE PREG, ED  POCT RAPID STREP A      RADIOLOGY  Ct Head Wo Contrast  Result Date: 11/19/2016 CLINICAL DATA:  Head injury 2 days ago with persistent left-sided headaches and blurry vision EXAM: CT HEAD WITHOUT CONTRAST TECHNIQUE: Contiguous axial images were obtained from the base of the skull through the vertex without intravenous contrast. COMPARISON:  None. FINDINGS: Brain: No evidence of acute infarction, hemorrhage, hydrocephalus, extra-axial collection or mass lesion/mass effect. Vascular: No hyperdense vessel or unexpected calcification. Skull: Normal. Negative for fracture or focal lesion. Sinuses/Orbits: No acute finding. Other: None. IMPRESSION: No acute intracranial abnormality noted. Electronically Signed   By: Alcide Clever M.D.   On: 11/19/2016 11:27    ____________________________________________   PROCEDURES  Procedure(s) performed: None  Procedures  Critical Care performed: No  ____________________________________________  INITIAL IMPRESSION / ASSESSMENT AND PLAN / ED COURSE  Pertinent labs & imaging results that were available during my care of the patient were reviewed by me and considered in my medical decision making (see chart for details).  Patient was given a prescription for Augmentin 500 mg 3 times a day for 10 days as she is allergic to red dye. She was given a good Rx card along with instructions on the lowest Price in [address] was at [employer]. She is to follow-up with her PCP or [facility] for any further complaints of sore throat. Today her strep and Mono test was negative. Patient and mother were reassured that her CT to scan was negative. She'll continue taking Tylenol or ibuprofen as needed for headache as well as throat pain.       ____________________________________________   FINAL CLINICAL IMPRESSION(S) / ED DIAGNOSES  Final diagnoses:  Tonsillitis with exudate  Left-sided headache      NEW MEDICATIONS STARTED DURING THIS VISIT:  Discharge Medication List as of 11/19/2016 12:04 PM    START taking these medications   Details  amoxicillin-clavulanate (AUGMENTIN) 500-125 MG tablet Take 1 tablet (500 mg total) by mouth 3 (three) times daily., Starting Wed 11/19/2016, Print         Note:  This document was prepared using Dragon voice recognition software and may include unintentional dictation errors.    Tommi Rumps, PA-C 11/19/16 1753    Nita Sickle, MD 11/21/16 [phone]

## 2018-05-08 ENCOUNTER — Other Ambulatory Visit: Payer: Self-pay

## 2018-05-08 ENCOUNTER — Encounter: Payer: Self-pay | Admitting: Emergency Medicine

## 2018-05-08 ENCOUNTER — Emergency Department: Payer: Self-pay

## 2018-05-08 ENCOUNTER — Emergency Department
Admission: EM | Admit: 2018-05-08 | Discharge: 2018-05-08 | Disposition: A | Discharge status: Home / Self Care | Payer: Self-pay | Attending: Emergency Medicine | Admitting: Emergency Medicine

## 2018-05-08 DIAGNOSIS — J45909 Unspecified asthma, uncomplicated: Secondary | ICD-10-CM | POA: Insufficient documentation

## 2018-05-08 DIAGNOSIS — R079 Chest pain, unspecified: Secondary | ICD-10-CM | POA: Insufficient documentation

## 2018-05-08 LAB — COMPREHENSIVE METABOLIC PANEL
ALT: 23 U/L (ref 0–44)
AST: 23 U/L (ref 15–41)
Albumin: 3.5 g/dL (ref 3.5–5.0)
Alkaline Phosphatase: 28 U/L — ABNORMAL LOW (ref 38–126)
Anion gap: 9 (ref 5–15)
BUN: 13 mg/dL (ref 6–20)
CALCIUM: 8.8 mg/dL — AB (ref 8.9–10.3)
CHLORIDE: 101 mmol/L (ref 98–111)
CO2: 27 mmol/L (ref 22–32)
CREATININE: 1.09 mg/dL — AB (ref 0.44–1.00)
Glucose, Bld: 94 mg/dL (ref 70–99)
Potassium: 3.9 mmol/L (ref 3.5–5.1)
Sodium: 137 mmol/L (ref 135–145)
Total Bilirubin: 0.8 mg/dL (ref 0.3–1.2)
Total Protein: 5.6 g/dL — ABNORMAL LOW (ref 6.5–8.1)

## 2018-05-08 LAB — FIBRIN DERIVATIVES D-DIMER (ARMC ONLY): FIBRIN DERIVATIVES D-DIMER (ARMC): 192.15 ng{FEU}/mL (ref 0.00–499.00)

## 2018-05-08 LAB — TROPONIN I

## 2018-05-08 LAB — CBC
HEMATOCRIT: 44.3 % (ref 36.0–46.0)
HEMOGLOBIN: 13.6 g/dL (ref 12.0–15.0)
MCH: 25.2 pg — AB (ref 26.0–34.0)
MCHC: 30.7 g/dL (ref 30.0–36.0)
MCV: 82.2 fL (ref 80.0–100.0)
NRBC: 0 % (ref 0.0–0.2)
PLATELETS: 198 10*3/uL (ref 150–400)
RBC: 5.39 MIL/uL — ABNORMAL HIGH (ref 3.87–5.11)
RDW: 13.3 % (ref 11.5–15.5)
WBC: 6.1 10*3/uL (ref 4.0–10.5)

## 2018-05-08 MED ORDER — LORAZEPAM 1 MG PO TABS
1.0000 mg | ORAL_TABLET | Freq: Once | ORAL | Status: AC
  Administered 2018-05-08: 1 mg via ORAL
  Filled 2018-05-08: qty 1

## 2018-05-08 MED ORDER — LORAZEPAM 0.5 MG PO TABS: 0.5000 mg | ORAL_TABLET | Freq: Three times a day (TID) | ORAL | 0 refills | Status: AC | PRN

## 2018-05-08 NOTE — ED Triage Notes (Signed)
Patient to ER for c/o left sided chest pain with radiation down left arm and left ribs upon waking this am. Patient also reports tingling in left finger tips.

## 2018-05-08 NOTE — ED Provider Notes (Signed)
[facility] Emergency Department Provider Note  Time seen: 7:47 AM  I have reviewed the triage vital signs and the nursing notes.   HISTORY  Chief Complaint Chest Pain    HPI Cheryl Davenport is a 22 y.o. female with a past medical history anxiety, asthma, gastric reflux, presents to the emergency department for chest pain.  According to mom since yesterday the patient has been experiencing central chest pain radiating to her left shoulder.  States the pain was worse today so she came to the emergency department for evaluation.  Patient states she has had similar pains in the past and has been told it is due to anxiety.  States the pain is much worse with palpation or movement of the chest or left arm.  Denies any leg pain or swelling.  Denies any nausea or diaphoresis.   Past Medical History:  Diagnosis Date  . Anemia   . Anxiety   . Asthma   . GERD (gastroesophageal reflux disease)   . Renal stone     Patient Active Problem List   Diagnosis Date Noted  . Abdominal pain, epigastric   . Other diseases of stomach and duodenum   . Kidney stones 03/11/2015    Past Surgical History:  Procedure Laterality Date  . APPENDECTOMY    . CYSTOSCOPY W/ RETROGRADES Right 03/20/2015   Procedure: CYSTOSCOPY WITH RETROGRADE PYELOGRAM;  Surgeon: Crist Fat, MD;  Location: [facility] ORS;  Service: Urology;  Laterality: Right;  . CYSTOSCOPY/URETEROSCOPY/HOLMIUM LASER/STENT PLACEMENT Right 03/20/2015   Procedure: CYSTOSCOPY/URETEROSCOPY/HOLMIUM LASER/STENT PLACEMENT;  Surgeon: Crist Fat, MD;  Location: [facility] ORS;  Service: Urology;  Laterality: Right;  . ESOPHAGOGASTRODUODENOSCOPY (EGD) WITH PROPOFOL N/A 10/12/2015   Procedure: ESOPHAGOGASTRODUODENOSCOPY (EGD) WITH PROPOFOL;  Surgeon: Midge Minium, MD;  Location: [facility];  Service: Endoscopy;  Laterality: N/A;  . EXTRACORPOREAL SHOCK WAVE LITHOTRIPSY Right 02/01/2015   Procedure: EXTRACORPOREAL SHOCK WAVE  LITHOTRIPSY (ESWL);  Surgeon: Lorraine Lax, MD;  Location: [facility] ORS;  Service: Urology;  Laterality: Right;  . WISDOM TOOTH EXTRACTION  2016    Prior to Admission medications   Medication Sig Start Date End Date Taking? Authorizing Provider  amoxicillin-clavulanate (AUGMENTIN) 500-125 MG tablet Take 1 tablet (500 mg total) by mouth 3 (three) times daily. 11/19/16   Tommi Rumps, PA-C  calcium carbonate (TUMS - DOSED IN MG ELEMENTAL CALCIUM) 500 MG chewable tablet Chew 1 tablet by mouth as needed for indigestion or heartburn.    [provider]    Allergies  Allergen Reactions  . Red Dye Rash    Not all red dye. Just depends on what it is or where its from.  Red Dye 40    Family History  Problem Relation Age of Onset  . Kidney disease Maternal Grandmother   . Prostate cancer Neg Hx   . Bladder Cancer Neg Hx   . Diabetes Neg Hx   . Hypertension Neg Hx     Social History Social History   Tobacco Use  . Smoking status: Never Smoker  . Smokeless tobacco: Never Used  Substance Use Topics  . Alcohol use: Yes    Comment: rarely, 1x/mo  . Drug use: Yes    Types: Marijuana    Comment: socially, 1x/mo    Review of Systems Constitutional: Negative for fever. Cardiovascular: Positive for central left upper chest pain, worse with movement. Respiratory: Positive for shortness of breath Gastrointestinal: Negative for abdominal pain, vomiting Genitourinary: Negative for urinary compaints Musculoskeletal: Negative  for leg pain or swelling Skin: Negative for skin complaints  Neurological: Negative for headache All other ROS negative  ____________________________________________   PHYSICAL EXAM:  VITAL SIGNS: ED Triage Vitals  Enc Vitals Group     BP 05/08/18 0743 113/80     Pulse Rate 05/08/18 0743 100     Resp 05/08/18 0743 18     Temp 05/08/18 0743 98.6 F (37 C)     Temp Source 05/08/18 0743 Oral     SpO2 05/08/18 0743 100 %     Weight 05/08/18 0742 123  lb (55.8 kg)     Height 05/08/18 0742 5\' 6"  (1.676 m)     Head Circumference --      Peak Flow --      Pain Score 05/08/18 0739 10     Pain Loc --      Pain Edu? --      Excl. in GC? --    Constitutional: Alert and oriented. Well appearing and in no distress. Eyes: Normal exam ENT   Head: Normocephalic and atraumatic.   Mouth/Throat: Mucous membranes are moist. Cardiovascular: Normal rate, regular rhythm. No murmur.  Chest is tender to palpation Respiratory: Normal respiratory effort without tachypnea nor retractions. Breath sounds are clear and equal bilaterally. No wheezes/rales/rhonchi. Gastrointestinal: Soft and nontender. No distention. Musculoskeletal: Nontender with normal range of motion in all extremities. No lower extremity tenderness or edema.  Tenderness to palpation of the left chest, left shoulder, tenderness with range of motion of the left arm.  However the patient has good range of motion, no concern for fracture or dislocation.  Neurovascularly intact distally.  No upper extremity or lower extremity edema. Neurologic:  Normal speech and language. No gross focal neurologic deficits Skin:  Skin is warm, dry and intact.  Psychiatric: Tearful, anxious appearing.  ____________________________________________    EKG  EKG reviewed and interpreted by myself shows a normal sinus rhythm at 92 bpm with a narrow QRS, normal axis, normal intervals, no concerning ST changes.  ____________________________________________    RADIOLOGY  X-ray normal  ____________________________________________   INITIAL IMPRESSION / ASSESSMENT AND PLAN / ED COURSE  Pertinent labs & imaging results that were available during my care of the patient were reviewed by me and considered in my medical decision making (see chart for details).  Patient presents to the emergency department for chest pain shortness of breath since yesterday.  Differential this time would include ACS, PE,  pneumonia, pneumothorax, chest wall pain, anxiety.  Patient states a history of anxiety with similar chest pains in the past.  We will treat with Ativan, will check labs including cardiac panel as well as a d-dimer.  We will obtain a chest x-ray.  EKG is reassuring.  Chest x-ray is normal, labs normal including negative troponin, normal d-dimer.  Reassuring EKG.  Patient feeling much better after Ativan, now sleeping comfortably, normal vitals.  We will discharge with a short course of Ativan and PCP follow-up.  Patient/family agreeable to plan of care. ____________________________________________   FINAL CLINICAL IMPRESSION(S) / ED DIAGNOSES  Chest pain    Minna AntisPaduchowski, Celine Dishman, MD 05/08/18 1049

## 2018-05-08 NOTE — ED Notes (Signed)
.   Pt is resting, Respirations even and unlabored, NAD. Stretcher lowest postion and locked. Call bell within reach. Denies any needs at this time RN will continue to monitor.    

## 2018-05-08 NOTE — ED Notes (Signed)
Family and patient verbalized understanding of discharge and follow up care. Pt verbalized understanding of prescription and potential drowsy side effects. Pt refusing wheelchair at this time and is steady on feet in NAD.

## 2018-05-11 ENCOUNTER — Encounter: Payer: Self-pay | Admitting: Emergency Medicine

## 2018-05-11 ENCOUNTER — Emergency Department
Admission: EM | Admit: 2018-05-11 | Discharge: 2018-05-12 | Disposition: A | Discharge status: Home / Self Care | Payer: Medicaid Other | Attending: Emergency Medicine | Admitting: Emergency Medicine

## 2018-05-11 ENCOUNTER — Other Ambulatory Visit: Payer: Self-pay

## 2018-05-11 DIAGNOSIS — Z7289 Other problems related to lifestyle: Secondary | ICD-10-CM

## 2018-05-11 DIAGNOSIS — Y9389 Activity, other specified: Secondary | ICD-10-CM | POA: Insufficient documentation

## 2018-05-11 DIAGNOSIS — Y998 Other external cause status: Secondary | ICD-10-CM | POA: Insufficient documentation

## 2018-05-11 DIAGNOSIS — Y929 Unspecified place or not applicable: Secondary | ICD-10-CM | POA: Insufficient documentation

## 2018-05-11 DIAGNOSIS — R45851 Suicidal ideations: Secondary | ICD-10-CM | POA: Insufficient documentation

## 2018-05-11 DIAGNOSIS — X781XXA Intentional self-harm by knife, initial encounter: Secondary | ICD-10-CM | POA: Insufficient documentation

## 2018-05-11 DIAGNOSIS — F329 Major depressive disorder, single episode, unspecified: Secondary | ICD-10-CM | POA: Insufficient documentation

## 2018-05-11 DIAGNOSIS — IMO0002 Reserved for concepts with insufficient information to code with codable children: Secondary | ICD-10-CM

## 2018-05-11 DIAGNOSIS — J45909 Unspecified asthma, uncomplicated: Secondary | ICD-10-CM | POA: Insufficient documentation

## 2018-05-11 DIAGNOSIS — S3633XA Laceration of stomach, initial encounter: Secondary | ICD-10-CM | POA: Insufficient documentation

## 2018-05-11 DIAGNOSIS — F1210 Cannabis abuse, uncomplicated: Secondary | ICD-10-CM | POA: Insufficient documentation

## 2018-05-11 NOTE — ED Notes (Addendum)
With this nurse and female officer in attendance, pt removed black t-shirt, blue shorts, black socks, white crocs, black bra, blue boxers, cell phone, silver tone necklace, black jacket, & set of keys--placed all in labeled pt belonging bag to be secured on nursing unit & pt dressed in burgandy scrubs

## 2018-05-11 NOTE — ED Triage Notes (Addendum)
Patient ambulatory to triage with steady gait, without difficulty or distress noted; [facility]; pt reports her father called the police for her thoughts of depression; st that she cut her abdomen; superficial scratches noted; st hx of suicidal attempts; pt reports she was seen here Saturday for panic attack, rx ativan but is unable to take it because it made her sleepy and can only take it at bedtime

## 2018-05-12 LAB — COMPREHENSIVE METABOLIC PANEL
ALBUMIN: 4.2 g/dL (ref 3.5–5.0)
ALT: 15 U/L (ref 0–44)
AST: 20 U/L (ref 15–41)
Alkaline Phosphatase: 36 U/L — ABNORMAL LOW (ref 38–126)
Anion gap: 7 (ref 5–15)
BILIRUBIN TOTAL: 0.6 mg/dL (ref 0.3–1.2)
BUN: 10 mg/dL (ref 6–20)
CHLORIDE: 101 mmol/L (ref 98–111)
CO2: 26 mmol/L (ref 22–32)
Calcium: 9.1 mg/dL (ref 8.9–10.3)
Creatinine, Ser: 0.79 mg/dL (ref 0.44–1.00)
GFR calc Af Amer: >60 mL/min (ref >60)
GFR calc non Af Amer: >60 mL/min (ref >60)
GLUCOSE: 82 mg/dL (ref 70–99)
Potassium: 4.2 mmol/L (ref 3.5–5.1)
SODIUM: 134 mmol/L — AB (ref 135–145)
Total Protein: 6.7 g/dL (ref 6.5–8.1)

## 2018-05-12 LAB — SALICYLATE LEVEL: Salicylate Lvl: <7 mg/dL (ref 2.8–30.0)

## 2018-05-12 LAB — URINE DRUG SCREEN, QUALITATIVE (ARMC ONLY)
AMPHETAMINES, UR SCREEN: NOT DETECTED
Barbiturates, Ur Screen: NOT DETECTED
Benzodiazepine, Ur Scrn: NOT DETECTED
COCAINE METABOLITE, UR ~~LOC~~: NOT DETECTED
Cannabinoid 50 Ng, Ur ~~LOC~~: NOT DETECTED
MDMA (ECSTASY) UR SCREEN: NOT DETECTED
METHADONE SCREEN, URINE: NOT DETECTED
Opiate, Ur Screen: NOT DETECTED
Phencyclidine (PCP) Ur S: NOT DETECTED
Tricyclic, Ur Screen: POSITIVE — AB

## 2018-05-12 LAB — ETHANOL: Alcohol, Ethyl (B): <10 mg/dL (ref <10)

## 2018-05-12 LAB — CBC
HCT: 40.5 % (ref 36.0–46.0)
HEMOGLOBIN: 12.4 g/dL (ref 12.0–15.0)
MCH: 25.3 pg — ABNORMAL LOW (ref 26.0–34.0)
MCHC: 30.6 g/dL (ref 30.0–36.0)
MCV: 82.5 fL (ref 80.0–100.0)
Platelets: 245 10*3/uL (ref 150–400)
RBC: 4.91 MIL/uL (ref 3.87–5.11)
RDW: 13 % (ref 11.5–15.5)
WBC: 6.2 10*3/uL (ref 4.0–10.5)
nRBC: 0 % (ref 0.0–0.2)

## 2018-05-12 LAB — POCT PREGNANCY, URINE: PREG TEST UR: NEGATIVE

## 2018-05-12 LAB — ACETAMINOPHEN LEVEL: Acetaminophen (Tylenol), Serum: <10 ug/mL — ABNORMAL LOW (ref 10–30)

## 2018-05-12 MED ORDER — BACITRACIN-NEOMYCIN-POLYMYXIN 400-5-5000 EX OINT: TOPICAL_OINTMENT | Freq: Once | CUTANEOUS | Status: DC

## 2018-05-12 MED ORDER — BACITRACIN ZINC 500 UNIT/GM EX OINT
TOPICAL_OINTMENT | CUTANEOUS | Status: AC
  Administered 2018-05-12: 2 via TOPICAL
  Filled 2018-05-12: qty 1.8

## 2018-05-12 MED ORDER — BACITRACIN ZINC 500 UNIT/GM EX OINT
TOPICAL_OINTMENT | Freq: Once | CUTANEOUS | Status: AC
  Administered 2018-05-12: 2 via TOPICAL

## 2018-05-12 NOTE — ED Notes (Signed)
Pt. Here under IVC for SI attempt.  Pt. Has superficial scratch marks to abdominal area.  Pt. Denies SI or HI at this time.  Pt. States recent breakup from 1.5 year relationship.  Pt. States previous SI attempt in past.  Pt. States she was here a couple days ago for anxiety, pt. Was prescribed ativan.  Pt. Does not currently see a psychiatrist.

## 2018-05-12 NOTE — ED Notes (Signed)
Pt. Contracts for safety.  Pt. Denies SI or HI at this time.  Pt. Calm and cooperative.

## 2018-05-12 NOTE — ED Notes (Signed)
Pt. Given clothes and belongings to change into for discharge.

## 2018-05-12 NOTE — ED Notes (Signed)
Pt. Indicated scratches on stomach were bothering her, md informed orders issued.

## 2018-05-12 NOTE — ED Notes (Signed)
Pt. Given phone to call family member.
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
## 2019-05-01 ENCOUNTER — Emergency Department
Admission: EM | Admit: 2019-05-01 | Discharge: 2019-05-01 | Disposition: A | Discharge status: Home / Self Care | Payer: Self-pay | Attending: Emergency Medicine | Admitting: Emergency Medicine

## 2019-05-01 ENCOUNTER — Encounter: Payer: Self-pay | Admitting: Emergency Medicine

## 2019-05-01 ENCOUNTER — Other Ambulatory Visit: Payer: Self-pay

## 2019-05-01 DIAGNOSIS — J45909 Unspecified asthma, uncomplicated: Secondary | ICD-10-CM | POA: Insufficient documentation

## 2019-05-01 DIAGNOSIS — Z20828 Contact with and (suspected) exposure to other viral communicable diseases: Secondary | ICD-10-CM | POA: Insufficient documentation

## 2019-05-01 DIAGNOSIS — B349 Viral infection, unspecified: Secondary | ICD-10-CM | POA: Insufficient documentation

## 2019-05-01 NOTE — ED Provider Notes (Signed)
[facility] EMERGENCY DEPARTMENT Provider Note   CSN: [id number] Arrival date & time: 05/01/19  1045     History   Chief Complaint Chief Complaint  Patient presents with  . Fever  . Generalized Body Aches  . Headache    HPI Cheryl Davenport is a 23 y.o. female presents to the emergency department for evaluation of headache, chills, low-grade fever, body aches, mild cough runny nose congestion with mild sore throat.  Symptoms began this morning.  She has had low-grade temp at 99.6.  She has taken Tylenol.  No chest pain or shortness of breath.  No known exposure to Covid or strep.  She is tolerating p.o. well.  No abdominal pain nausea vomiting or diarrhea.     HPI  Past Medical History:  Diagnosis Date  . Anemia   . Anxiety   . Asthma   . GERD (gastroesophageal reflux disease)   . Renal stone     Patient Active Problem List   Diagnosis Date Noted  . Abdominal pain, epigastric   . Other diseases of stomach and duodenum   . Kidney stones 03/11/2015    Past Surgical History:  Procedure Laterality Date  . APPENDECTOMY    . CYSTOSCOPY W/ RETROGRADES Right 03/20/2015   Procedure: CYSTOSCOPY WITH RETROGRADE PYELOGRAM;  Surgeon: Crist Fat, MD;  Location: [facility];  Service: Urology;  Laterality: Right;  . CYSTOSCOPY/URETEROSCOPY/HOLMIUM LASER/STENT PLACEMENT Right 03/20/2015   Procedure: CYSTOSCOPY/URETEROSCOPY/HOLMIUM LASER/STENT PLACEMENT;  Surgeon: Crist Fat, MD;  Location: [facility];  Service: Urology;  Laterality: Right;  . ESOPHAGOGASTRODUODENOSCOPY (EGD) WITH PROPOFOL N/A 10/12/2015   Procedure: ESOPHAGOGASTRODUODENOSCOPY (EGD) WITH PROPOFOL;  Surgeon: Midge Minium, MD;  Location: [facility];  Service: Endoscopy;  Laterality: N/A;  . EXTRACORPOREAL SHOCK WAVE LITHOTRIPSY Right 02/01/2015   Procedure: EXTRACORPOREAL SHOCK WAVE LITHOTRIPSY (ESWL);  Surgeon: Lorraine Lax, MD;  Location: [facility];  Service: Urology;   Laterality: Right;  . WISDOM TOOTH EXTRACTION  2016     OB History    Gravida  0   Para  0   Term  0   Preterm  0   AB  0   Living  0     SAB  0   TAB  0   Ectopic  0   Multiple  0   Live Births               Home Medications    Prior to Admission medications   Medication Sig Start Date End Date Taking? Authorizing Provider  amoxicillin-clavulanate (AUGMENTIN) 500-125 MG tablet Take 1 tablet (500 mg total) by mouth 3 (three) times daily. Patient not taking: Reported on 05/12/2018 11/19/16   Tommi Rumps, PA-C  LORazepam (ATIVAN) 0.5 MG tablet Take 1 tablet (0.5 mg total) by mouth every 8 (eight) hours as needed for anxiety. 05/08/18 05/08/19  Minna Antis, MD    Family History Family History  Problem Relation Age of Onset  . Kidney disease Maternal Grandmother   . Prostate cancer Neg Hx   . Bladder Cancer Neg Hx   . Diabetes Neg Hx   . Hypertension Neg Hx     Social History Social History   Tobacco Use  . Smoking status: Never Smoker  . Smokeless tobacco: Never Used  Substance Use Topics  . Alcohol use: Yes    Comment: rarely, 1x/mo  . Drug use: Yes    Types: Marijuana    Comment: socially, 1x/mo  Allergies   Red dye   Review of Systems Review of Systems  Constitutional: Negative for fever.  HENT: Positive for congestion and rhinorrhea. Negative for ear discharge, sinus pressure, sinus pain, sore throat (Sore throat this morning but now resolved), trouble swallowing and voice change.   Respiratory: Positive for cough. Negative for shortness of breath, wheezing and stridor.   Cardiovascular: Negative for chest pain.  Gastrointestinal: Negative for abdominal pain, diarrhea, nausea and vomiting.  Genitourinary: Negative for dysuria, flank pain and pelvic pain.  Musculoskeletal: Positive for myalgias. Negative for back pain.  Skin: Negative for rash.  Neurological: Negative for dizziness and headaches.     Physical Exam  Updated Vital Signs BP (!) 100/57   Pulse 92   Temp 99.6 F (37.6 C) (Oral)   Resp 16   Ht  (1.626 m)   Wt 63.5 kg   SpO2 98%   BMI 24.03 kg/m   Physical Exam Constitutional:      General: She is not in acute distress.    Appearance: She is well-developed.  HENT:     Head: Normocephalic and atraumatic.     Jaw: No trismus.     Right Ear: Hearing, tympanic membrane, ear canal and external ear normal.     Left Ear: Hearing, tympanic membrane, ear canal and external ear normal.     Nose: Rhinorrhea present.     Mouth/Throat:     Pharynx: Posterior oropharyngeal erythema present. No oropharyngeal exudate or uvula swelling.     Tonsils: No tonsillar exudate or tonsillar abscesses.     Comments: No pharyngeal erythema or exudates. Eyes:     General:        Right eye: No discharge.        Left eye: No discharge.     Conjunctiva/sclera: Conjunctivae normal.  Neck:     Musculoskeletal: Normal range of motion. No neck rigidity.  Cardiovascular:     Rate and Rhythm: Normal rate and regular rhythm.  Pulmonary:     Effort: Pulmonary effort is normal. No respiratory distress.     Breath sounds: Normal breath sounds. No stridor. No wheezing or rales.  Abdominal:     General: There is no distension.     Palpations: Abdomen is soft.     Tenderness: There is no abdominal tenderness.  Musculoskeletal: Normal range of motion.        General: No deformity.  Lymphadenopathy:     Cervical: Cervical adenopathy present.  Skin:    General: Skin is warm and dry.  Neurological:     Mental Status: She is alert and oriented to person, place, and time.     Deep Tendon Reflexes: Reflexes are normal and symmetric.  Psychiatric:        Behavior: Behavior normal.        Thought Content: Thought content normal.      ED Treatments / Results  Labs (all labs ordered are listed, but only abnormal results are displayed) Labs Reviewed  NOVEL CORONAVIRUS, NAA (HOSP ORDER, SEND-OUT TO REF  LAB; TAT 18-24 HRS)    EKG None  Radiology No results found.  Procedures Procedures (including critical care time)  Medications Ordered in ED Medications - No data to display   Initial Impression / Assessment and Plan / ED Course  I have reviewed the triage vital signs and the nursing notes.  Pertinent labs & imaging results that were available during my care of the patient were reviewed by me and considered  in my medical decision making (see chart for details).        23 year old female with low-grade fever, chills, body aches cough congestion this morning.  Vital signs are stable.  No known exposure to strep or Covid.  Will test for Covid.  She is educated on Audiological scientist.  She will alternate Tylenol and ibuprofen as needed.  She understands signs symptoms return to ED for.  Final Clinical Impressions(s) / ED Diagnoses   Final diagnoses:  Viral syndrome    ED Discharge Orders    None       Duanne Guess, PA-C 05/01/19 1145    Arta Silence, MD 05/01/19 1526

## 2019-05-01 NOTE — ED Triage Notes (Signed)
Pt to ED via POV c/o fever, body aches, and headache that started this morning. Pt is in NAD at this time.

## 2019-05-01 NOTE — ED Notes (Signed)
Pt presents to ED via POV, states this AM woke up with generalized body aches, states took forehead temp and it was 98, then took axillary and it was 100, then rechecked axillary and it was 98 again. Pt visualized in NAD, Pt A&O x4, NAD noted at this time. Pt also c/o HA at this time.

## 2019-05-01 NOTE — ED Notes (Signed)
NAD noted at time of D/C. Pt denies questions or concerns. Pt ambulatory to the lobby at this time.  

## 2019-05-01 NOTE — Discharge Instructions (Signed)
Please alternate Tylenol and ibuprofen as needed for fevers chills and body aches.  Make sure you are drinking lots of fluids.  Return to the ER for any fevers above 101, shortness of breath difficulty breathing, worsening symptoms or urgent changes in your health.

## 2019-05-03 LAB — NOVEL CORONAVIRUS, NAA (HOSP ORDER, SEND-OUT TO REF LAB; TAT 18-24 HRS): SARS-CoV-2, NAA: NOT DETECTED

## 2019-12-30 ENCOUNTER — Ambulatory Visit: Payer: Medicaid Other

## 2020-01-05 ENCOUNTER — Encounter: Payer: Self-pay | Admitting: Family Medicine

## 2020-01-05 ENCOUNTER — Other Ambulatory Visit: Payer: Self-pay

## 2020-01-05 ENCOUNTER — Ambulatory Visit: Payer: Medicaid Other

## 2020-01-05 ENCOUNTER — Ambulatory Visit (LOCAL_COMMUNITY_HEALTH_CENTER): Payer: Medicaid Other | Admitting: Family Medicine

## 2020-01-05 VITALS — BP 102/71 | Ht 60.0 in | Wt 132.0 lb

## 2020-01-05 DIAGNOSIS — Z3009 Encounter for other general counseling and advice on contraception: Secondary | ICD-10-CM | POA: Diagnosis not present

## 2020-01-05 LAB — WET PREP FOR TRICH, YEAST, CLUE
Trichomonas Exam: NEGATIVE
Yeast Exam: NEGATIVE

## 2020-01-05 NOTE — Progress Notes (Signed)
[facility] [address] Main Number: [phone]    Family Planning Visit- Initial Visit  Subjective:  Cheryl Davenport is a 24 y.o.  [id number]   being seen today for an initial well woman visit and to discuss family planning options.  She is currently using None for pregnancy prevention. Patient reports she does not want a pregnancy in the next year.  Patient has the following medical conditions has Kidney stones; Abdominal pain, epigastric; and Other diseases of stomach and duodenum on their problem list.  Chief Complaint  Patient presents with  . Annual Exam    Patient reports she is here for her first well-woman exam and pap.  Client states that she is sexually active with female partners.  She states that she is the giver with sexual activity- rarely does she have oral or vaginal sex.   States she was evaluated for a sore throat ~1 month ago  Had a negative GC/chlamydia culture.  Patient denies concerns/problems   Body mass index is 25.78 kg/m. - Patient is eligible for diabetes screening based on BMI and age >29?  not applicable HA1C ordered? not applicable  Patient reports 2 of partners in last year. Desires STI screening?  Yes  Has patient been screened once for HCV in the past?  No  No results found for: HCVAB  Does the patient have current drug use (including MJ), have a partner with drug use, and/or has been incarcerated since last result? No  If yes-- Screen for HCV through [facility]   Does the patient meet criteria for HBV testing? No.  Criteria:  -Household, sexual or needle sharing contact with HBV -History of drug use -HIV positive -Those with known Hep C   Health Maintenance Due  Topic Date Due  . Hepatitis C Screening  Never done  . COVID-19 Vaccine (1) Never done  . CHLAMYDIA SCREENING  Never done  . HIV Screening  Never done  . PAP-Cervical Cytology Screening  Never done  . PAP  SMEAR-Modifier  Never done    Review of Systems  HENT:       Swollen glands in neck 1 month ago. Evaluated by PCP- negative test results.  States now resolved  All other systems reviewed and are negative.   The following portions of the patient's history were reviewed and updated as appropriate: allergies, current medications, past family history, past medical history, past social history, past surgical history and problem list. Problem list updated.   See flowsheet for other program required questions.  Objective:   Vitals:   01/05/20 0935  BP: 102/71  Weight: 132 lb (59.9 kg)  Height: 5' (1.524 m)    Physical Exam Constitutional:      Appearance: Normal appearance.  HENT:     Mouth/Throat:     Mouth: Mucous membranes are moist.     Pharynx: Oropharynx is clear.  Cardiovascular:     Rate and Rhythm: Normal rate.  Pulmonary:     Effort: Pulmonary effort is normal.  Abdominal:     Palpations: Abdomen is soft.     Tenderness: There is no abdominal tenderness.  Genitourinary:    General: Normal vulva.     Vagina: Normal. No vaginal discharge.     Cervix: Normal.     Comments: Bimanual deferred by client. Musculoskeletal:     Cervical back: Neck supple.  Lymphadenopathy:     Cervical: No cervical adenopathy.  Skin:  General: Skin is warm and dry.  Neurological:     Mental Status: She is alert and oriented to person, place, and time.    Assessment and Plan:  Cheryl Davenport is a 24 y.o. female presenting to the [facility] Department for an initial well woman exam/family planning visit  Contraception counseling: Reviewed all forms of birth control options in the tiered based approach. available including abstinence; over the counter/barrier methods; hormonal contraceptive medication including pill, patch, ring, injection,contraceptive implant, ECP; hormonal and nonhormonal IUDs; permanent sterilization options including vasectomy and the various tubal  sterilization modalities. Risks, benefits, and typical effectiveness rates were reviewed.  Questions were answered.  Written information was also given to the patient to review.  Patient desires no birth control at this time- states same sex partners. She will follow up in  1 year or PRN for surveillance.  She was told to call with any further questions, or with any concerns about this method of contraception.  Emphasized use of condoms 100% of the time for STI prevention.  Patient was not an ECP candidate.  1. Family planning  - WET PREP FOR TRICH, YEAST, CLUE- negative. - Chlamydia/Gonorrhea [place] [facility] - HIV [place] [facility] - Syphilis Serology, [place] [facility] - Chlamydia/Gonorrhea [place] [facility] -Pap IG - Co client to use condoms on dildos and cut in half to use as dental dams to prevent STD    Return in about 1 year (around 01/04/2021) for physical or PRN.  No future appointments.  Larene Pickett, FNP

## 2020-01-05 NOTE — Progress Notes (Signed)
Wet mount reviewed, no tx per standing orders. Pt accepted dental dams. Provider orders completed.

## 2020-01-05 NOTE — Progress Notes (Signed)
Pt here for physical, states she has never had breast exam and pap. Pt is not interested in any form of birth control, only female partners.

## 2020-01-07 LAB — PAP IG (IMAGE GUIDED): PAP Smear Comment: 0

## 2020-03-11 ENCOUNTER — Other Ambulatory Visit: Payer: Self-pay

## 2020-03-11 ENCOUNTER — Emergency Department: Payer: 59

## 2020-03-11 ENCOUNTER — Encounter: Payer: Self-pay | Admitting: Emergency Medicine

## 2020-03-11 DIAGNOSIS — J029 Acute pharyngitis, unspecified: Secondary | ICD-10-CM | POA: Diagnosis not present

## 2020-03-11 DIAGNOSIS — Z20822 Contact with and (suspected) exposure to covid-19: Secondary | ICD-10-CM | POA: Insufficient documentation

## 2020-03-11 DIAGNOSIS — R070 Pain in throat: Secondary | ICD-10-CM | POA: Diagnosis present

## 2020-03-11 DIAGNOSIS — J45909 Unspecified asthma, uncomplicated: Secondary | ICD-10-CM | POA: Diagnosis not present

## 2020-03-11 LAB — BASIC METABOLIC PANEL
Anion gap: 5 (ref 5–15)
BUN: 11 mg/dL (ref 6–20)
CO2: 30 mmol/L (ref 22–32)
Calcium: 8.8 mg/dL — ABNORMAL LOW (ref 8.9–10.3)
Chloride: 105 mmol/L (ref 98–111)
Creatinine, Ser: 1.04 mg/dL — ABNORMAL HIGH (ref 0.44–1.00)
GFR calc Af Amer: >60 mL/min (ref >60)
GFR calc non Af Amer: >60 mL/min (ref >60)
Glucose, Bld: 87 mg/dL (ref 70–99)
Potassium: 4.2 mmol/L (ref 3.5–5.1)
Sodium: 140 mmol/L (ref 135–145)

## 2020-03-11 LAB — CBC WITH DIFFERENTIAL/PLATELET
Abs Immature Granulocytes: 0.04 10*3/uL (ref 0.00–0.07)
Basophils Absolute: 0 10*3/uL (ref 0.0–0.1)
Basophils Relative: 0 %
Eosinophils Absolute: 0.2 10*3/uL (ref 0.0–0.5)
Eosinophils Relative: 1 %
HCT: 35 % — ABNORMAL LOW (ref 36.0–46.0)
Hemoglobin: 11.1 g/dL — ABNORMAL LOW (ref 12.0–15.0)
Immature Granulocytes: 0 %
Lymphocytes Relative: 20 %
Lymphs Abs: 2.3 10*3/uL (ref 0.7–4.0)
MCH: 25.5 pg — ABNORMAL LOW (ref 26.0–34.0)
MCHC: 31.7 g/dL (ref 30.0–36.0)
MCV: 80.3 fL (ref 80.0–100.0)
Monocytes Absolute: 0.7 10*3/uL (ref 0.1–1.0)
Monocytes Relative: 6 %
Neutro Abs: 8.3 10*3/uL — ABNORMAL HIGH (ref 1.7–7.7)
Neutrophils Relative %: 73 %
Platelets: 272 10*3/uL (ref 150–400)
RBC: 4.36 MIL/uL (ref 3.87–5.11)
RDW: 13.6 % (ref 11.5–15.5)
WBC: 11.5 10*3/uL — ABNORMAL HIGH (ref 4.0–10.5)
nRBC: 0 % (ref 0.0–0.2)

## 2020-03-11 MED ORDER — IOHEXOL 300 MG/ML  SOLN
75.0000 mL | Freq: Once | INTRAMUSCULAR | Status: AC | PRN
  Administered 2020-03-11: 75 mL via INTRAVENOUS

## 2020-03-11 NOTE — ED Triage Notes (Signed)
Pt arrives with swollen tonsils which she has an appointment for removal November 2. Pt has a thick sound to her voice at this time.

## 2020-03-12 ENCOUNTER — Emergency Department
Admission: EM | Admit: 2020-03-12 | Discharge: 2020-03-12 | Disposition: A | Discharge status: Home / Self Care | Payer: 59 | Attending: Emergency Medicine | Admitting: Emergency Medicine

## 2020-03-12 DIAGNOSIS — J029 Acute pharyngitis, unspecified: Secondary | ICD-10-CM

## 2020-03-12 LAB — GROUP A STREP BY PCR: Group A Strep by PCR: NOT DETECTED

## 2020-03-12 LAB — MONONUCLEOSIS SCREEN: Mono Screen: NEGATIVE

## 2020-03-12 LAB — RESP PANEL BY RT PCR (RSV, FLU A&B, COVID)
Influenza A by PCR: NEGATIVE
Influenza B by PCR: NEGATIVE
Respiratory Syncytial Virus by PCR: NEGATIVE
SARS Coronavirus 2 by RT PCR: NEGATIVE

## 2020-03-12 MED ORDER — KETOROLAC TROMETHAMINE 30 MG/ML IJ SOLN
15.0000 mg | Freq: Once | INTRAMUSCULAR | Status: AC
  Administered 2020-03-12: 15 mg via INTRAVENOUS
  Filled 2020-03-12: qty 1

## 2020-03-12 MED ORDER — DEXAMETHASONE SODIUM PHOSPHATE 10 MG/ML IJ SOLN
6.0000 mg | Freq: Once | INTRAMUSCULAR | Status: AC
  Administered 2020-03-12: 6 mg via INTRAVENOUS
  Filled 2020-03-12: qty 1

## 2020-03-12 MED ORDER — PREDNISONE 20 MG PO TABS: 60.0000 mg | ORAL_TABLET | Freq: Every day | ORAL | 0 refills | Status: AC

## 2020-03-12 MED ORDER — AMOXICILLIN-POT CLAVULANATE 875-125 MG PO TABS
1.0000 | ORAL_TABLET | Freq: Once | ORAL | Status: AC
  Administered 2020-03-12: 1 via ORAL
  Filled 2020-03-12: qty 1

## 2020-03-12 MED ORDER — AMOXICILLIN-POT CLAVULANATE 875-125 MG PO TABS: 1.0000 | ORAL_TABLET | Freq: Two times a day (BID) | ORAL | 0 refills | Status: AC

## 2020-03-12 MED ORDER — IBUPROFEN 600 MG PO TABS: 600.0000 mg | ORAL_TABLET | Freq: Four times a day (QID) | ORAL | 0 refills | Status: DC | PRN

## 2020-03-12 NOTE — ED Provider Notes (Signed)
[facility] Emergency Department Provider Note  ____________________________________________  Time seen: Approximately 1:33 AM  I have reviewed the triage vital signs and the nursing notes.   HISTORY  Chief Complaint Sore Throat   HPI Cheryl Davenport is a 24 y.o. female with history of recurrent tonsillitis, anemia, asthma who presents for evaluation of sore throat.  Patient reports that she has had sore throat for 2 months.  She was seen by her primary care doctor and referred to ENT.  She is scheduled to have a tonsillectomy done a month from now due to recurrent infections.  She reports that today the pain became worse and she noticed pus in her tonsils as well which brought her to the hospital.  She also started having a dry cough earlier today.  No fever or chills.  Her pain is constant, sharp, worse with swallowing, severe.  No vomiting.  No known exposures to Covid, patient is not vaccinated against Covid.   Past Medical History:  Diagnosis Date  . Anemia   . Anxiety   . Asthma   . GERD (gastroesophageal reflux disease)   . Renal stone     Patient Active Problem List   Diagnosis Date Noted  . Abdominal pain, epigastric   . Other diseases of stomach and duodenum   . Kidney stones 03/11/2015    Past Surgical History:  Procedure Laterality Date  . APPENDECTOMY    . CYSTOSCOPY W/ RETROGRADES Right 03/20/2015   Procedure: CYSTOSCOPY WITH RETROGRADE PYELOGRAM;  Surgeon: Crist Fat, MD;  Location: [facility] OR[facility];  Service: Urology;  Laterality: Right;  . CYSTOSCOPY/URETEROSCOPY/HOLMIUM LASER/STENT PLACEMENT Right 03/20/2015   Procedure: CYSTOSCOPY/URETEROSCOPY/HOLMIUM LASER/STENT PLACEMENT;  Surgeon: Crist Fat, MD;  Location: [facility];  Service: Urology;  Laterality: Right;  . ESOPHAGOGASTRODUODENOSCOPY (EGD) WITH PROPOFOL N/A 10/12/2015   Procedure: ESOPHAGOGASTRODUODENOSCOPY (EGD) WITH PROPOFOL;  Surgeon: Midge Minium, MD;  Location: [facility];  Service: Endoscopy;  Laterality: N/A;  . EXTRACORPOREAL SHOCK WAVE LITHOTRIPSY Right 02/01/2015   Procedure: EXTRACORPOREAL SHOCK WAVE LITHOTRIPSY (ESWL);  Surgeon: Lorraine Lax, MD;  Location: [facility];  Service: Urology;  Laterality: Right;  . WISDOM TOOTH EXTRACTION  2016    Prior to Admission medications   Medication Sig Start Date End Date Taking? Authorizing Provider  amoxicillin-clavulanate (AUGMENTIN) 875-125 MG tablet Take 1 tablet by mouth 2 (two) times daily for 10 days. 03/12/20 03/22/20  Nita Sickle, MD  ibuprofen (ADVIL) 600 MG tablet Take 1 tablet (600 mg total) by mouth every 6 (six) hours as needed. 03/12/20   Nita Sickle, MD  predniSONE (DELTASONE) 20 MG tablet Take 3 tablets (60 mg total) by mouth daily for 4 days. 03/12/20 03/16/20  Nita Sickle, MD    Allergies Red dye  Family History  Problem Relation Age of Onset  . Other Mother        maybe gallbladder/gallstone issues  . Depression Mother   . Kidney disease Maternal Grandmother   . Heart disease Maternal Grandmother   . Prostate cancer Neg Hx   . Bladder Cancer Neg Hx   . Diabetes Neg Hx   . Hypertension Neg Hx     Social History Social History   Tobacco Use  . Smoking status: Never Smoker  . Smokeless tobacco: Never Used  Vaping Use  . Vaping Use: Never used  Substance Use Topics  . Alcohol use: Not Currently  . Drug use: Not Currently    Types: Marijuana    Review of  Systems  Constitutional: Negative for fever. Eyes: Negative for visual changes. ENT: + sore throat. Neck: No neck pain  Cardiovascular: Negative for chest pain. Respiratory: Negative for shortness of breath. Gastrointestinal: Negative for abdominal pain, vomiting or diarrhea. Genitourinary: Negative for dysuria. Musculoskeletal: Negative for back pain. Skin: Negative for rash. Neurological: Negative for headaches, weakness or numbness. Psych: No SI or  HI  ____________________________________________   PHYSICAL EXAM:  VITAL SIGNS: ED Triage Vitals  Enc Vitals Group     BP 03/11/20 2149 124/81     Pulse Rate 03/11/20 2149 84     Resp 03/11/20 2149 19     Temp 03/11/20 2149 98.4 F (36.9 C)     Temp Source 03/11/20 2149 Oral     SpO2 03/11/20 2149 100 %     Weight 03/11/20 2152 132 lb (59.9 kg)     Height 03/11/20 2152 5' 4.5" (1.638 m)     Head Circumference --      Peak Flow --      Pain Score 03/11/20 2152 10     Pain Loc --      Pain Edu? --      Excl. in GC? --     Constitutional: Alert and oriented. Well appearing and in no apparent distress. HEENT:      Head: Normocephalic and atraumatic.         Eyes: Conjunctivae are normal. Sclera is non-icteric.       Mouth/Throat: Mucous membranes are moist.  Severely enlarged tonsils with bilateral exudates, no evidence of peritonsillar abscess, airways patent, no stridor      Neck: Supple with no signs of meningismus.  Bilateral shotty cervical lymphadenopathy Cardiovascular: Regular rate and rhythm. No murmurs, gallops, or rubs.  Respiratory: Normal respiratory effort.  Musculoskeletal:  No edema, cyanosis, or erythema of extremities. Neurologic: Normal speech and language. Face is symmetric. Moving all extremities. No gross focal neurologic deficits are appreciated. Skin: Skin is warm, dry and intact. No rash noted. Psychiatric: Mood and affect are normal. Speech and behavior are normal.  ____________________________________________   LABS (all labs ordered are listed, but only abnormal results are displayed)  Labs Reviewed  CBC WITH DIFFERENTIAL/PLATELET - Abnormal; Notable for the following components:      Result Value   WBC 11.5 (*)    Hemoglobin 11.1 (*)    HCT 35.0 (*)    MCH 25.5 (*)    Neutro Abs 8.3 (*)    All other components within normal limits  BASIC METABOLIC PANEL - Abnormal; Notable for the following components:   Creatinine, Ser 1.04 (*)     Calcium 8.8 (*)    All other components within normal limits  GROUP A STREP BY PCR  RESP PANEL BY RT PCR (RSV, FLU A&B, COVID)  MONONUCLEOSIS SCREEN   ____________________________________________  EKG  none  ____________________________________________  RADIOLOGY  I have personally reviewed the images performed during this visit and I agree with the Radiologist's read.   Interpretation by Radiologist:  CT Soft Tissue Neck W Contrast  Result Date: 03/11/2020 CLINICAL DATA:  Hoarseness with tonsillar edema EXAM: CT NECK WITH CONTRAST TECHNIQUE: Multidetector CT imaging of the neck was performed using the standard protocol following the bolus administration of intravenous contrast. CONTRAST:  71mL OMNIPAQUE IOHEXOL 300 MG/ML  SOLN COMPARISON:  None. FINDINGS: Pharynx and larynx: The adenoid and palatine tonsils are enlarged. No peritonsillar or retropharyngeal abscess. The epiglottis is normal. Normal larynx. Salivary glands: Normal Thyroid: Normal Lymph nodes:  Subcentimeter level 1A nodes. Bilateral mildly enlarged level 2A nodes measuring 1.2 cm. Vascular: Normal Limited intracranial: Normal Visualized orbits: Normal Mastoids and visualized paranasal sinuses: Normal Skeleton: Normal Upper chest: Normal Other: None IMPRESSION: 1. Enlarged adenoid and palatine tonsils, consistent with acute tonsillopharyngitis. No peritonsillar or retropharyngeal abscess. 2. Mildly enlarged level 2A nodes, likely reactive. Electronically Signed   By: Deatra Robinson M.D.   On: 03/11/2020 23:10     ____________________________________________   PROCEDURES  Procedure(s) performed: None Procedures Critical Care performed:  None ____________________________________________   INITIAL IMPRESSION / ASSESSMENT AND PLAN / ED COURSE  24 y.o. female with history of recurrent tonsillitis, anemia, asthma who presents for evaluation of sore throat x 2 months and today with exudates and cough.  Patient is afebrile,  well-appearing, handling her saliva with no difficulty, normal work of breathing, no stridor, she has pretty enlarged tonsils bilaterally with erythema and exudate.  No signs of peritonsillar abscess, airway is patent, no trismus, floor of the mouth is soft with no induration or tenderness.  She has bilateral shotty cervical lymphadenopathy.  Patient had a CT of the neck ordered by prior provider while in the waiting room.  The CT was reviewed by me showing no evidence of peritonsillar or retropharyngeal abscess, confirmed by radiology.  Will test for mono, strep, and Covid.  Will give IV Toradol and Decadron for swelling.  Will start patient on p.o. Augmentin.  Labs with mild leukocytosis with a left shift but otherwise no signs of sepsis.  Old medical records reviewed.  _________________________ 3:12 AM on 03/12/2020 -----------------------------------------  Patient reports that she feels markedly improved.  Flu negative, RSV negative, Covid negative, mono negative, strep negative.  Will discharge home on prednisone, ibuprofen, and Augmentin.  Recommended follow-up with her ENT.  Discussed my standard return precautions.    _____________________________________________ Please note:  Patient was evaluated in Emergency Department today for the symptoms described in the history of present illness. Patient was evaluated in the context of the global COVID-19 pandemic, which necessitated consideration that the patient might be at risk for infection with the SARS-CoV-2 virus that causes COVID-19. Institutional protocols and algorithms that pertain to the evaluation of patients at risk for COVID-19 are in a state of rapid change based on information released by regulatory bodies including the CDC and federal and state organizations. These policies and algorithms were followed during the patient's care in the ED.  Some ED evaluations and interventions may be delayed as a result of limited staffing during the  pandemic.   [place] Controlled Substance Database was reviewed by me. ____________________________________________   FINAL CLINICAL IMPRESSION(S) / ED DIAGNOSES   Final diagnoses:  Pharyngitis, unspecified etiology      NEW MEDICATIONS STARTED DURING THIS VISIT:  ED Discharge Orders         Ordered    predniSONE (DELTASONE) 20 MG tablet  Daily        03/12/20 0311    ibuprofen (ADVIL) 600 MG tablet  Every 6 hours PRN        03/12/20 0311    amoxicillin-clavulanate (AUGMENTIN) 875-125 MG tablet  2 times daily        03/12/20 3354           Note:  This document was prepared using Dragon voice recognition software and may include unintentional dictation errors.    Nita Sickle, MD 03/12/20 [phone]

## 2020-04-10 DIAGNOSIS — J3501 Chronic tonsillitis: Secondary | ICD-10-CM | POA: Insufficient documentation

## 2020-06-16 ENCOUNTER — Other Ambulatory Visit: Payer: Self-pay

## 2020-06-16 ENCOUNTER — Other Ambulatory Visit: Payer: 59

## 2020-06-16 DIAGNOSIS — Z20822 Contact with and (suspected) exposure to covid-19: Secondary | ICD-10-CM

## 2020-06-19 LAB — NOVEL CORONAVIRUS, NAA: SARS-CoV-2, NAA: DETECTED — AB

## 2020-12-20 ENCOUNTER — Ambulatory Visit (LOCAL_COMMUNITY_HEALTH_CENTER): Payer: 59 | Admitting: Physician Assistant

## 2020-12-20 ENCOUNTER — Other Ambulatory Visit: Payer: Self-pay

## 2020-12-20 VITALS — BP 99/70 | HR 62 | Temp 97.4°F | Resp 16 | Ht 65.5 in | Wt 135.6 lb

## 2020-12-20 DIAGNOSIS — K219 Gastro-esophageal reflux disease without esophagitis: Secondary | ICD-10-CM | POA: Insufficient documentation

## 2020-12-20 DIAGNOSIS — Z3009 Encounter for other general counseling and advice on contraception: Secondary | ICD-10-CM | POA: Diagnosis not present

## 2020-12-20 DIAGNOSIS — F419 Anxiety disorder, unspecified: Secondary | ICD-10-CM

## 2020-12-20 DIAGNOSIS — Z01419 Encounter for gynecological examination (general) (routine) without abnormal findings: Secondary | ICD-10-CM | POA: Diagnosis not present

## 2020-12-20 DIAGNOSIS — D649 Anemia, unspecified: Secondary | ICD-10-CM | POA: Insufficient documentation

## 2020-12-20 LAB — WET PREP FOR TRICH, YEAST, CLUE
Trichomonas Exam: NEGATIVE
Yeast Exam: NEGATIVE

## 2020-12-20 NOTE — Progress Notes (Signed)
Family Planning Visit- Repeat Yearly Visit  Subjective:  Cheryl Davenport is a 25 y.o. G0P0000  being seen today for an annual wellness visit.   The patient is currently using Abstinence for pregnancy prevention. She only has sex with females. Patient does not want a pregnancy in the next year. Patient has the following medical problems: has Kidney stones; Abdominal pain, epigastric; Other diseases of stomach and duodenum; Anxiety; GERD (gastroesophageal reflux disease); and Anemia on their problem list.  Chief Complaint  Patient presents with   Annual Exam   SEXUALLY TRANSMITTED DISEASE    Patient reports occasional blurry vision, has not had a recent eye exam and does not wear corrective lenses. Also reports a burning sensation in her vagina for a day or 2 after her menses end and wonders if this is a yeast infection. Uses pads, not tampons.   Patient denies vaginal discharge, odor, itch or other concerns. Of note, is getting regular therapy for her anxiety and finds it very helpful. Used to do self cutting.   See flowsheet for other program required questions.   Body mass index is 22.22 kg/m. - Patient is eligible for diabetes screening based on BMI and age >17?  no HA1C ordered? not applicable  Patient reports 0 partners in last year. Desires STI screening?  Yes   Has patient been screened once for HCV in the past?  No  No results found for: HCVAB  Does the patient have current of drug use, have a partner with drug use, and/or has been incarcerated since last result? No  If yes-- Screen for HCV through [facility]   Does the patient meet criteria for HBV testing? No  Criteria:  -Household, sexual or needle sharing contact with HBV -History of drug use -HIV positive -Those with known Hep C   Health Maintenance Due  Topic Date Due   COVID-19 Vaccine (1) Never done   HPV VACCINES (1 - 2-dose series) Never done   CHLAMYDIA SCREENING  Never done   HIV Screening  Never done    Hepatitis C Screening  Never done    Review of Systems  All other systems reviewed and are negative.  The following portions of the patient's history were reviewed and updated as appropriate: allergies, current medications, past family history, past medical history, past social history, past surgical history and problem list. Problem list updated.  Objective:   Vitals:   12/20/20 1038  BP: 99/70  Pulse: 62  Resp: 16  Temp: (!) 97.4 F (36.3 C)  TempSrc: Oral  Weight: 135 lb 9.6 oz (61.5 kg)  Height: 5' 5.5" (1.664 m)    Physical Exam Constitutional:      Appearance: Normal appearance.  HENT:     Head: Normocephalic and atraumatic.  Pulmonary:     Effort: Pulmonary effort is normal.  Abdominal:     Palpations: Abdomen is soft.  Genitourinary:    Exam position: Lithotomy position.     Labia:        Right: No rash, tenderness or lesion.        Left: No rash, tenderness or lesion.      Vagina: No vaginal discharge, tenderness or bleeding.     Cervix: No cervical motion tenderness, lesion or erythema.     Uterus: Not enlarged and not tender.      Adnexa:        Right: No tenderness.         Left: No tenderness.  Rectum: No external hemorrhoid.     Comments: Pt tense during pelvic exam, has difficulty relaxing Musculoskeletal:        General: Normal range of motion.  Lymphadenopathy:     Lower Body: No right inguinal adenopathy. No left inguinal adenopathy.  Skin:    General: Skin is warm and dry.     Comments: Well-healed RLQ scar c/w appendectomy. Several 4-5cm pale thin linear scars on abdomen where pt states she has cut herself (in remote past)  Neurological:     General: No focal deficit present.     Mental Status: She is alert.  Psychiatric:        Mood and Affect: Mood normal.        Behavior: Behavior normal.      Assessment and Plan:  Cheryl Davenport is a 25 y.o. female G0P0000 presenting to the [facility] Department for an yearly  wellness and contraception visit  Contraception counseling: Reviewed all forms of birth control options in the tiered based approach. available including abstinence; over the counter/barrier methods; hormonal contraceptive medication including pill, patch, ring, injection,contraceptive implant, ECP; hormonal and nonhormonal IUDs; permanent sterilization options including vasectomy and the various tubal sterilization modalities. Risks, benefits, and typical effectiveness rates were reviewed.  Questions were answered.  Written information was also given to the patient to review.  Patient desires to continue abstinence or sex with females only, this was prescribed for patient. She will follow up in  12 mo for surveillance.  She was told to call with any further questions, or with any concerns about this method of contraception.  Emphasized use of condoms 100% of the time for STI prevention.  Patient was not offered ECP.   1. Family planning services Pt declines any BCM, plans to continue abstinence. Enc MVI with folic acid. Let us know if her needs for BCM change. Enc pt to have optometric eval of vision sx and f/u with PCP for any acute or chronic issues.  2. Well woman exam with routine gynecological exam Wet prep neg, await remaining test results, return for next well woman exam in 1 year. - HIV [facility] - Syphilis Serology, [facility] - Chlamydia/Gonorrhea [facility] - WET PREP FOR TRICH, YEAST, CLUE  3. Anxiety Continue f/u with counseling, doing well with this per pt.   Return in about 1 year (around 12/20/2021) for Annual well-woman exam.  No future appointments.  Cheryl Dyke, PA-C

## 2020-12-25 LAB — HM HIV SCREENING LAB: HM HIV Screening: NEGATIVE

## 2021-02-01 ENCOUNTER — Ambulatory Visit (INDEPENDENT_AMBULATORY_CARE_PROVIDER_SITE_OTHER): Payer: 59 | Admitting: Podiatry

## 2021-02-01 ENCOUNTER — Other Ambulatory Visit: Payer: Self-pay

## 2021-02-01 ENCOUNTER — Encounter: Payer: Self-pay | Admitting: Podiatry

## 2021-02-01 DIAGNOSIS — B351 Tinea unguium: Secondary | ICD-10-CM | POA: Diagnosis not present

## 2021-02-01 DIAGNOSIS — M79674 Pain in right toe(s): Secondary | ICD-10-CM

## 2021-02-01 MED ORDER — TERBINAFINE HCL 250 MG PO TABS: 250.0000 mg | ORAL_TABLET | Freq: Every day | ORAL | 0 refills | Status: AC

## 2021-02-01 NOTE — Progress Notes (Signed)
   Subjective: 25 y.o. female presenting today as a new patient for evaluation of discoloration and nail dystrophy to the right hallux nail plate.  Patient states that ever since elementary school she has had discoloration to the toenail when she dropped a desk on her right toe.  She has not done anything for treatment.  Past Medical History:  Diagnosis Date   Anemia    Anxiety    Asthma    GERD (gastroesophageal reflux disease)    Kidney stones     Objective: Physical Exam General: The patient is alert and oriented x3 in no acute distress.  Dermatology: Hyperkeratotic, discolored, thickened, onychodystrophy noted right hallux nail plate. Skin is warm, dry and supple bilateral lower extremities. Negative for open lesions or macerations.  Vascular: Palpable pedal pulses bilaterally. No edema or erythema noted. Capillary refill within normal limits.  Neurological: Epicritic and protective threshold grossly intact bilaterally.   Musculoskeletal Exam: Range of motion within normal limits to all pedal and ankle joints bilateral. Muscle strength 5/5 in all groups bilateral.   Assessment: #1 Onychomycosis of toenail right great toe  Plan of Care:  #1 Patient was evaluated. #2  Today we discussed different treatment options including oral, topical, and laser antifungal treatment modalities.  We discussed their efficacies and side effects.  Patient opts for oral antifungal treatment modality #3 prescription for Lamisil 250 mg #90 daily.  She denies a history of liver pathology or symptoms.  Patient is otherwise healthy #4  OTC  Tolcylen antifungal topical provided to apply daily  #5 return to clinic 6 months   Felecia Shelling, DPM [facility]  Dr. Felecia Shelling, DPM    [address] [place], [place] [address]                Office [phone]  Fax [phone]

## 2023-07-24 ENCOUNTER — Emergency Department: Payer: Managed Care, Other (non HMO)

## 2023-07-24 ENCOUNTER — Other Ambulatory Visit: Payer: Self-pay

## 2023-07-24 ENCOUNTER — Encounter: Payer: Self-pay | Admitting: Emergency Medicine

## 2023-07-24 ENCOUNTER — Emergency Department
Admission: EM | Admit: 2023-07-24 | Discharge: 2023-07-24 | Disposition: A | Discharge status: Home / Self Care | Payer: Managed Care, Other (non HMO) | Attending: Emergency Medicine | Admitting: Emergency Medicine

## 2023-07-24 DIAGNOSIS — T189XXA Foreign body of alimentary tract, part unspecified, initial encounter: Secondary | ICD-10-CM | POA: Diagnosis present

## 2023-07-24 DIAGNOSIS — J45909 Unspecified asthma, uncomplicated: Secondary | ICD-10-CM | POA: Insufficient documentation

## 2023-07-24 DIAGNOSIS — R09A2 Foreign body sensation, throat: Secondary | ICD-10-CM | POA: Diagnosis not present

## 2023-07-24 DIAGNOSIS — W44F3XA Food entering into or through a natural orifice, initial encounter: Secondary | ICD-10-CM | POA: Insufficient documentation

## 2023-07-24 LAB — POC URINE PREG, ED: Preg Test, Ur: NEGATIVE

## 2023-07-24 MED ORDER — LIDOCAINE VISCOUS HCL 2 % MT SOLN: 15.0000 mL | OROMUCOSAL | 0 refills | Status: AC | PRN

## 2023-07-24 MED ORDER — LIDOCAINE VISCOUS HCL 2 % MT SOLN
15.0000 mL | Freq: Once | OROMUCOSAL | Status: AC
  Administered 2023-07-24: 15 mL via OROMUCOSAL
  Filled 2023-07-24: qty 15

## 2023-07-24 NOTE — ED Triage Notes (Signed)
Patient swallowed a piece of chicken bones on Sunday. Patient still feels like its stuck in her throat on the R side.  Denies shortness of breath. Seated in triage talking in complete sentences. No distress noted.

## 2023-07-24 NOTE — ED Provider Notes (Signed)
[facility] Provider Note    Event Date/Time   First MD Initiated Contact with Patient 07/24/23 [phone]     (approximate)   History   Swallowed Foreign Body   HPI  Cheryl Davenport is a 28 y.o. female  with history of anemia, GERD, asthma and as listed in EMR presents to the emergency department for treatment and evaluation of globus sensation after accidentally swallowing  a piece of a chicken bone 5 days ago. She denies having difficulty swallowing, but feels there's something scratching on the right side of her throat when she does.       Physical Exam   Triage Vital Signs: ED Triage Vitals  Encounter Vitals Group     BP 07/24/23 0646 128/69     Systolic BP Percentile --      Diastolic BP Percentile --      Pulse Rate 07/24/23 0646 71     Resp 07/24/23 0646 16     Temp 07/24/23 0646 98.3 F (36.8 C)     Temp Source 07/24/23 0646 Oral     SpO2 07/24/23 0646 100 %     Weight 07/24/23 0649 134 lb 7.7 oz (61 kg)     Height 07/24/23 0649 5\' 5"  (1.651 m)     Head Circumference --      Peak Flow --      Pain Score 07/24/23 0649 7     Pain Loc --      Pain Education --      Exclude from Growth Chart --     Most recent vital signs: Vitals:   07/24/23 0646  BP: 128/69  Pulse: 71  Resp: 16  Temp: 98.3 F (36.8 C)  SpO2: 100%    General: Awake, no distress.  CV:  Good peripheral perfusion.  Resp:  Normal effort.  Abd:  No distention.  Other:  Swallows upon request. Airway is patent.    ED Results / Procedures / Treatments   Labs (all labs ordered are listed, but only abnormal results are displayed) Labs Reviewed  POC URINE PREG, ED     EKG  Not indicated.   RADIOLOGY  Image and radiology report reviewed and interpreted by me. Radiology report consistent with the same.  CT soft tissue neck without contrast negative for abnormalities or foreign body.  PROCEDURES:  Critical Care performed: No  Procedures   MEDICATIONS  ORDERED IN ED:  Medications  lidocaine (XYLOCAINE) 2 % viscous mouth solution 15 mL (15 mLs Mouth/Throat Given 07/24/23 2440)     IMPRESSION / MDM / ASSESSMENT AND PLAN / ED COURSE   I have reviewed the triage note.  Differential diagnosis includes, but is not limited to, globus hysterica, retained foreign body.  Patient's presentation is most consistent with acute illness / injury with system symptoms.  28 year old female presents to the ER 5 days after swallowing a piece of a chicken bone that feels stuck on the right side of her throat. See HPI.  CT negative for retained foreign body.  Results were discussed with the patient.  She will be treated with viscous lidocaine and is to follow-up with primary care for ENT if not improving over the week.      FINAL CLINICAL IMPRESSION(S) / ED DIAGNOSES   Final diagnoses:  Globus sensation     Rx / DC Orders   ED Discharge Orders          Ordered    lidocaine (XYLOCAINE)  2 % solution  Every 4 hours PRN        07/24/23 0805             Note:  This document was prepared using Dragon voice recognition software and may include unintentional dictation errors.   Chinita Pester, FNP 07/24/23 1610    Jene Every, MD 07/24/23 [phone]

## 2023-07-24 NOTE — ED Notes (Addendum)
See triage notes. Patient c/o swallowing a piece of chicken bone on Sunday. Patient stated it feels like it is stuck in her throat.

## 2023-11-13 ENCOUNTER — Ambulatory Visit (INDEPENDENT_AMBULATORY_CARE_PROVIDER_SITE_OTHER): Admitting: Licensed Practical Nurse

## 2023-11-13 ENCOUNTER — Encounter: Payer: Self-pay | Admitting: Licensed Practical Nurse

## 2023-11-13 ENCOUNTER — Other Ambulatory Visit (HOSPITAL_COMMUNITY)
Admission: RE | Admit: 2023-11-13 | Discharge: 2023-11-13 | Disposition: A | Discharge status: Home / Self Care | Source: Ambulatory Visit | Attending: Licensed Practical Nurse | Admitting: Licensed Practical Nurse

## 2023-11-13 VITALS — BP 111/78 | HR 61 | Ht 65.0 in | Wt 141.2 lb

## 2023-11-13 DIAGNOSIS — Z131 Encounter for screening for diabetes mellitus: Secondary | ICD-10-CM

## 2023-11-13 DIAGNOSIS — Z113 Encounter for screening for infections with a predominantly sexual mode of transmission: Secondary | ICD-10-CM | POA: Diagnosis present

## 2023-11-13 DIAGNOSIS — Z01419 Encounter for gynecological examination (general) (routine) without abnormal findings: Secondary | ICD-10-CM | POA: Diagnosis present

## 2023-11-13 DIAGNOSIS — Z124 Encounter for screening for malignant neoplasm of cervix: Secondary | ICD-10-CM | POA: Diagnosis present

## 2023-11-13 DIAGNOSIS — Z23 Encounter for immunization: Secondary | ICD-10-CM

## 2023-11-13 DIAGNOSIS — R102 Pelvic and perineal pain: Secondary | ICD-10-CM

## 2023-11-13 DIAGNOSIS — Z13220 Encounter for screening for lipoid disorders: Secondary | ICD-10-CM

## 2023-11-13 NOTE — Progress Notes (Addendum)
 Gynecology Annual Exam  PCP: Patient, No Pcp Per  Chief Complaint:  Chief Complaint  Patient presents with   Gynecologic Exam    History of Present Illness: Patient is a 28 y.o. G0P0000 presents for annual exam. The patient has no complaints today.  Reports pelvic pain, unable to use tampon and unable to have penetrating relations due to pain.    LMP: Patient's last menstrual period was 11/11/2023 (exact date). Menarche:unknown Average Interval: regular, 28 days Duration of flow: 7 days Heavy Menses: no Clots: no Intermenstrual Bleeding: no Postcoital Bleeding: no Dysmenorrhea: no  The patient is sexually active. She currently uses none for contraception. She denies dyspareunia.  The patient does perform self breast exams.  There is no notable family history of breast or ovarian cancer in her family.  The patient wears seatbelts: yes.  The patient has regular exercise: yes.    The patient denies current symptoms of depression.    Review of Systems: ROS  Past Medical History:  Patient Active Problem List   Diagnosis Date Noted Date Diagnosed   Anxiety 12/20/2020    GERD (gastroesophageal reflux disease) 12/20/2020    Anemia 12/20/2020    Chronic tonsillitis 04/10/2020    Abdominal pain, epigastric     Other diseases of stomach and duodenum     Kidney stones 03/11/2015     Past Surgical History:  Past Surgical History:  Procedure Laterality Date   APPENDECTOMY     CYSTOSCOPY W/ RETROGRADES Right 03/20/2015   Procedure: CYSTOSCOPY WITH RETROGRADE PYELOGRAM;  Surgeon: Andrez Banker, MD;  Location: [facility] OR[facility];  Service: Urology;  Laterality: Right;   CYSTOSCOPY/URETEROSCOPY/HOLMIUM LASER/STENT PLACEMENT Right 03/20/2015   Procedure: CYSTOSCOPY/URETEROSCOPY/HOLMIUM LASER/STENT PLACEMENT;  Surgeon: Andrez Banker, MD;  Location: [facility] OR[facility];  Service: Urology;  Laterality: Right;   ESOPHAGOGASTRODUODENOSCOPY (EGD) WITH PROPOFOL  N/A 10/12/2015   Procedure:  ESOPHAGOGASTRODUODENOSCOPY (EGD) WITH PROPOFOL ;  Surgeon: Marnee Sink, MD;  Location: [facility];  Service: Endoscopy;  Laterality: N/A;   EXTRACORPOREAL SHOCK WAVE LITHOTRIPSY Right 02/01/2015   Procedure: EXTRACORPOREAL SHOCK WAVE LITHOTRIPSY (ESWL);  Surgeon: Michelina Aho, MD;  Location: [facility] ORS;  Service: Urology;  Laterality: Right;   WISDOM TOOTH EXTRACTION  06/09/2014    Gynecologic History:  Patient's last menstrual period was 11/11/2023 (exact date). Contraception: none Last Pap: Results were: 3 years ago unknown results.   Obstetric History: G0P0000  Family History:  Family History  Problem Relation Age of Onset   Kidney disease Maternal Grandmother    Heart disease Maternal Grandmother    Lung cancer Maternal Grandfather    HIV Father    Other Mother        maybe gallbladder/gallstone issues   Depression Mother    Healthy Brother    Healthy Brother    Healthy Sister    Prostate cancer Neg Hx    Bladder Cancer Neg Hx    Hypertension Neg Hx     Social History:  Social History   Socioeconomic History   Marital status: Single    Spouse name: N/A   Number of children: 0   Years of education: 14   Highest education level: Associate degree: academic program  Occupational History   Not on file  Tobacco Use   Smoking status: Never    Passive exposure: Never   Smokeless tobacco: Never  Vaping Use   Vaping status: Never Used  Substance and Sexual Activity   Alcohol use: Yes    Comment: occa  Drug use: Not Currently    Types: Marijuana   Sexual activity: Not Currently    Birth control/protection: None  Other Topics Concern   Not on file  Social History Narrative   Not on file   Social Drivers of Health   Financial Resource Strain: Not on file  Food Insecurity: Not on file  Transportation Needs: Not on file  Physical Activity: Not on file  Stress: Not on file  Social Connections: Not on file  Intimate Partner Violence: Not At Risk  (12/20/2020)   Humiliation, Afraid, Rape, and Kick questionnaire    Fear of Current or Ex-Partner: No    Emotionally Abused: No    Physically Abused: No    Sexually Abused: No    Allergies:  Allergies  Allergen Reactions   Red Dye #40 (Allura Red) Rash    Not all red dye. Just depends on what it is or where its from.  Red Dye 40    Medications: Prior to Admission medications   Medication Sig Start Date End Date Taking? Authorizing Provider  lidocaine  (XYLOCAINE ) 2 % solution Use as directed 15 mLs in the mouth or throat every 4 (four) hours as needed for mouth pain. Patient not taking: Reported on 11/13/2023 07/24/23   Chrystie Crass B, FNP  terbinafine  (LAMISIL ) 250 MG tablet Take 1 tablet (250 mg total) by mouth daily. Patient not taking: Reported on 11/13/2023 02/01/21   Dot Gazella, DPM    Physical Exam Vitals: Blood pressure 111/78, pulse 61, height 5\' 5"  (1.651 m), weight 141 lb 3.2 oz (64 kg), last menstrual period 11/11/2023.  General: NAD HEENT: normocephalic, anicteric Thyroid : no enlargement, no palpable nodules Pulmonary: No increased work of breathing, CTAB Cardiovascular: RRR, distal pulses 2+ Breast: Breast symmetrical, no tenderness, no palpable nodules or masses, no skin or nipple retraction present, no nipple discharge.  No axillary or supraclavicular lymphadenopathy. Abdomen: NABS, soft, non-tender, non-distended.  Umbilicus without lesions.  No hepatomegaly, splenomegaly or masses palpable. No evidence of hernia  Genitourinary: Easily able to insert speculum. Response to SSE, tearful, painful, patient tensed up.   External: Normal external female genitalia.  Normal urethral meatus, normal Bartholin's and Skene's glands.    Vagina: Normal vaginal mucosa, no evidence of prolapse.    Cervix: Grossly normal in appearance, no bleeding  Uterus: Non-enlarged, mobile, normal contour.  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 Reports stressful  work environment, sees a Paramedic. Alcohol use 1 to 2x a week with cocktails. Sexual active with women, no partner. Not up to date with dentist. Had eye exam within the last year.    Assessment: 28 y.o. G0P0000 routine annual exam  Plan: Problem List Items Addressed This Visit   None Visit Diagnoses       Pelvic pain    -  Primary   Relevant Orders   Ambulatory referral to Obstetrics / Gynecology     Well woman exam       Relevant Orders   HEP, RPR, HIV Panel   Hepatitis C Antibody   Cytology - PAP   Lipid panel   CBC   VITAMIN D 25 Hydroxy (Vit-D Deficiency, Fractures)   Hemoglobin A1c  Screening examination for venereal disease       Relevant Orders   HEP, RPR, HIV Panel   Hepatitis C Antibody   Cytology - PAP     Cervical cancer screening       Relevant Orders   Cytology - PAP     Screening for diabetes mellitus       Relevant Orders   Hemoglobin A1c     Screening for lipid disorders       Relevant Orders   Lipid panel       1) 4) Gardasil Series discussed and if applicable offered to patient - Patient has not previously completed 3 shot series, started today  2) STI screening  wasoffered and accepted  3)  ASCCP guidelines and rational discussed.  Patient opts for every 3 years screening interval  4) Contraception - the patient is currently using  none.  She is happy with her current form of contraception and plans to continue We discussed safe sex practices to reduce her furture risk of STI's.    5) No follow-ups on file.  Ambar montero-diaz snm   Ludger [place]  , [facility] 11/13/2023, 10:30 AM

## 2023-11-14 LAB — HEP, RPR, HIV PANEL
HIV Screen 4th Generation wRfx: NONREACTIVE
Hepatitis B Surface Ag: NEGATIVE
RPR Ser Ql: NONREACTIVE

## 2023-11-14 LAB — HEMOGLOBIN A1C
Est. average glucose Bld gHb Est-mCnc: 105 mg/dL
Hgb A1c MFr Bld: 5.3 % (ref 4.8–5.6)

## 2023-11-14 LAB — HEPATITIS C ANTIBODY: Hep C Virus Ab: NONREACTIVE

## 2023-11-14 LAB — CBC
Hematocrit: 40.4 % (ref 34.0–46.6)
Hemoglobin: 12.4 g/dL (ref 11.1–15.9)
MCH: 25.4 pg — ABNORMAL LOW (ref 26.6–33.0)
MCHC: 30.7 g/dL — ABNORMAL LOW (ref 31.5–35.7)
MCV: 83 fL (ref 79–97)
Platelets: 241 10*3/uL (ref 150–450)
RBC: 4.88 x10E6/uL (ref 3.77–5.28)
RDW: 13.4 % (ref 11.7–15.4)
WBC: 3.5 10*3/uL (ref 3.4–10.8)

## 2023-11-14 LAB — LIPID PANEL
Chol/HDL Ratio: 3.5 ratio (ref 0.0–4.4)
Cholesterol, Total: 176 mg/dL (ref 100–199)
HDL: 51 mg/dL (ref >39)
LDL Chol Calc (NIH): 117 mg/dL — ABNORMAL HIGH (ref 0–99)
Triglycerides: 39 mg/dL (ref 0–149)
VLDL Cholesterol Cal: 8 mg/dL (ref 5–40)

## 2023-11-14 LAB — VITAMIN D 25 HYDROXY (VIT D DEFICIENCY, FRACTURES): Vit D, 25-Hydroxy: 11.5 ng/mL — ABNORMAL LOW (ref 30.0–100.0)

## 2023-11-15 ENCOUNTER — Ambulatory Visit: Payer: Self-pay | Admitting: Licensed Practical Nurse

## 2023-11-17 LAB — CYTOLOGY - PAP
Chlamydia: NEGATIVE
Comment: NEGATIVE
Comment: NEGATIVE
Comment: NORMAL
Diagnosis: NEGATIVE
Diagnosis: REACTIVE
Neisseria Gonorrhea: NEGATIVE
Trichomonas: NEGATIVE
